# Patient Record
Sex: Male | Born: 1970 | Race: Black or African American | Hispanic: No | Marital: Married | Smoking: Current some day smoker
Health system: Southern US, Community
[De-identification: ages and names within clinical notes are randomized; demographics above are authoritative.]

## PROBLEM LIST (undated history)

## (undated) DIAGNOSIS — J189 Pneumonia, unspecified organism: Secondary | ICD-10-CM

## (undated) DIAGNOSIS — F32A Depression, unspecified: Secondary | ICD-10-CM

## (undated) DIAGNOSIS — F99 Mental disorder, not otherwise specified: Secondary | ICD-10-CM

## (undated) DIAGNOSIS — Z21 Asymptomatic human immunodeficiency virus [HIV] infection status: Secondary | ICD-10-CM

## (undated) DIAGNOSIS — A4902 Methicillin resistant Staphylococcus aureus infection, unspecified site: Secondary | ICD-10-CM

## (undated) DIAGNOSIS — F329 Major depressive disorder, single episode, unspecified: Secondary | ICD-10-CM

## (undated) HISTORY — PX: OTHER SURGICAL HISTORY: SHX169

---

## 2009-03-08 ENCOUNTER — Emergency Department (HOSPITAL_COMMUNITY): Admission: EM | Admit: 2009-03-08 | Discharge: 2009-03-08 | Payer: Self-pay | Admitting: Emergency Medicine

## 2009-03-09 ENCOUNTER — Emergency Department (HOSPITAL_COMMUNITY): Admission: EM | Admit: 2009-03-09 | Discharge: 2009-03-09 | Payer: Self-pay | Admitting: Emergency Medicine

## 2010-03-15 ENCOUNTER — Emergency Department (HOSPITAL_COMMUNITY): Admission: EM | Admit: 2010-03-15 | Discharge: 2010-03-15 | Payer: Self-pay | Admitting: Emergency Medicine

## 2010-03-18 ENCOUNTER — Telehealth: Payer: Self-pay | Admitting: Internal Medicine

## 2010-04-02 ENCOUNTER — Telehealth: Payer: Self-pay | Admitting: Internal Medicine

## 2010-04-14 ENCOUNTER — Encounter: Payer: Self-pay | Admitting: Internal Medicine

## 2010-12-07 NOTE — Letter (Signed)
Summary: Certified Letter Receipt  Certified Letter Receipt   Imported By: Sherian Rein 05/31/2010 14:37:42  _____________________________________________________________________  External Attachment:    Type:   Image     Comment:   External Document

## 2010-12-07 NOTE — Letter (Signed)
Summary: [facility]  [address]   [place], [place] [address]   Phone: [phone]  Fax: [phone]    04/14/2010  Kyle Young [address] [place], [place]  [address]  Dear Kyle Young,      We have been attempting to contact you in regards to a missed appointment that was scheduled for 04/01/2010 with Dr. Marchelle Gearing. We have been unable to reach you to reschedule this appointment. Please contact our office at your earliest convenience so we can assist you in this matter. Thank you.         Sincerely,   Nature conservation officer Pulmonary Division

## 2010-12-07 NOTE — Progress Notes (Signed)
Summary: nos appt  Phone Note Call from Patient   Caller: [email or web address]  Call For: Nelle Sayed Summary of Call: ATC pt to rsc nos from 5/26, phone does not accept incoming calls, no other contact info available. Initial call taken by: Darletta Moll,  Apr 02, 2010 9:10 AM     Appended Document: nos appt pls send certified letter  Appended Document: nos appt letter printed and given to Raliegh Scarlet to send certified.   Appended Document: nos appt received delivery notice for the certified letter sent to pt. It was delivered on 04-19-10 and was signed for by [employer].

## 2010-12-07 NOTE — Progress Notes (Signed)
Summary: fu needed  Phone Note Outgoing Call   Summary of Call: jen, he went to er on 5/9 with hemoptysis. pls give him an appt with me. For some reason the ER sent me a note that I am his doc.  Initial call taken by: Kalman Shan MD,  Mar 18, 2010 4:21 PM  Follow-up for Phone Call        pt has appt on May 26th with MR.,

## 2010-12-23 ENCOUNTER — Other Ambulatory Visit: Payer: Self-pay | Admitting: General Surgery

## 2010-12-23 ENCOUNTER — Encounter (HOSPITAL_BASED_OUTPATIENT_CLINIC_OR_DEPARTMENT_OTHER): Payer: Self-pay

## 2010-12-23 ENCOUNTER — Ambulatory Visit (HOSPITAL_BASED_OUTPATIENT_CLINIC_OR_DEPARTMENT_OTHER)
Admission: RE | Admit: 2010-12-23 | Discharge: 2010-12-23 | Disposition: A | Discharge status: Home / Self Care | Payer: Managed Care, Other (non HMO) | Source: Ambulatory Visit | Attending: General Surgery | Admitting: General Surgery

## 2010-12-23 ENCOUNTER — Ambulatory Visit (HOSPITAL_COMMUNITY): Payer: Managed Care, Other (non HMO) | Attending: General Surgery

## 2010-12-23 DIAGNOSIS — F17200 Nicotine dependence, unspecified, uncomplicated: Secondary | ICD-10-CM | POA: Insufficient documentation

## 2010-12-23 DIAGNOSIS — K644 Residual hemorrhoidal skin tags: Secondary | ICD-10-CM | POA: Insufficient documentation

## 2010-12-23 DIAGNOSIS — Z8614 Personal history of Methicillin resistant Staphylococcus aureus infection: Secondary | ICD-10-CM | POA: Insufficient documentation

## 2010-12-23 DIAGNOSIS — Z01812 Encounter for preprocedural laboratory examination: Secondary | ICD-10-CM | POA: Insufficient documentation

## 2010-12-23 DIAGNOSIS — Z21 Asymptomatic human immunodeficiency virus [HIV] infection status: Secondary | ICD-10-CM | POA: Insufficient documentation

## 2010-12-23 DIAGNOSIS — E119 Type 2 diabetes mellitus without complications: Secondary | ICD-10-CM | POA: Insufficient documentation

## 2010-12-23 DIAGNOSIS — A630 Anogenital (venereal) warts: Secondary | ICD-10-CM | POA: Insufficient documentation

## 2010-12-23 DIAGNOSIS — Z794 Long term (current) use of insulin: Secondary | ICD-10-CM | POA: Insufficient documentation

## 2010-12-23 DIAGNOSIS — I517 Cardiomegaly: Secondary | ICD-10-CM | POA: Insufficient documentation

## 2010-12-23 DIAGNOSIS — K648 Other hemorrhoids: Secondary | ICD-10-CM | POA: Insufficient documentation

## 2010-12-23 DIAGNOSIS — Z01818 Encounter for other preprocedural examination: Secondary | ICD-10-CM | POA: Insufficient documentation

## 2010-12-23 LAB — DIFFERENTIAL
Basophils Absolute: 0 10*3/uL (ref 0.0–0.1)
Eosinophils Absolute: 0.1 10*3/uL (ref 0.0–0.7)
Lymphocytes Relative: 51 % — ABNORMAL HIGH (ref 12–46)
Lymphs Abs: 2.7 10*3/uL (ref 0.7–4.0)
Neutro Abs: 2.2 10*3/uL (ref 1.7–7.7)
Neutrophils Relative %: 42 % — ABNORMAL LOW (ref 43–77)

## 2010-12-23 LAB — BASIC METABOLIC PANEL
BUN: 15 mg/dL (ref 6–23)
Calcium: 9.5 mg/dL (ref 8.4–10.5)
Chloride: 107 mEq/L (ref 96–112)
GFR calc non Af Amer: >60 mL/min (ref >60)
Glucose, Bld: 239 mg/dL — ABNORMAL HIGH (ref 70–99)
Potassium: 3.9 mEq/L (ref 3.5–5.1)

## 2010-12-23 LAB — GLUCOSE, CAPILLARY: Glucose-Capillary: 231 mg/dL — ABNORMAL HIGH (ref 70–99)

## 2010-12-23 LAB — CBC
HCT: 41.2 % (ref 39.0–52.0)
WBC: 5.3 10*3/uL (ref 4.0–10.5)

## 2010-12-30 NOTE — Op Note (Signed)
[name], Young NO.:  [id number]  MEDICAL RECORD NO.:  [id number]          PATIENT TYPE:  LOCATION:                                 FACILITY:  PHYSICIAN:  Anselm Pancoast. Zachery Dakins, M.D.  DATE OF BIRTH:  DATE OF PROCEDURE:  12/23/2010 DATE OF DISCHARGE:                              OPERATIVE REPORT   REFERRING PHYSICIAN:  Caryn Bee L. Little, MD  PREOPERATIVE DIAGNOSIS:  Perianal and inner anal condyloma acuminata.  POSTOPERATIVE DIAGNOSIS:  Perianal and inner anal condyloma acuminata.  OPERATION:  Cauterization and excision of perianal condyloma and condyloma within the anal canal under general anesthesia in lithotomy position.  HISTORY:  Kyle Young is a 40 year old black male referred to me by Dr. Catha Gosselin for a perianal condyloma.  He is on his usual medications.  He is a diabetic and Dr. Clarene Duke is changing his insulin NovoLog to Lantus and his sugar this morning was 253, which the patient states is better than usual.  The Anesthesiology, Dr. Gilford Rile, says that we are to proceed on with the planned procedure.  We will check his glucose postoperatively.  He had his insulin last night and a light meal.  DESCRIPTION OF PROCEDURE:  He was given a gram of Ancef and the patient taken to the operative suite.  Induction of general anesthesia and LMA tube and then placed up in the [place] stirrups.  We brought his legs up as much as possible.  He is large, kind of chunky guy and this exposes the perianal areas nicely and I also see the condyloma within the anal canal.  I prepped him with Hibiclens and then draped him in a sterile manner with the sheet under the buttocks and towels and then the leggings.  The areas first on the larger lesions on the outside, I actually just trimmed them off and then cauterized the base.  Numerous samples were sent for pathology exam and then after the areas could be completely circumferentially treated and there was  probably 20-25 areas. I then used the anoscopes and he had got at least many areas up in the anal canal.  It was not really above the dentate line area, but with kind of mild internal and external hemorrhoids.  I cauterized these, excised them and then recauterized and then there were 3 little areas done with figure-of-8 sutures of 3-0 chromic for hemostasis since it was working right on the hemorrhoids.  After the areas had all been treated, reinspected to make sure I had not missed any areas.  I then used 5% lidocaine ointment within an open gauze and placed part of it within the anal canal for hemostasis and also pain medication.  The patient tolerated procedure nicely, and he will be released after a short stay in the recovery room.  He is to continue on all of his medications today especially his insulin and he sees Dr. Clarene Duke next week.  The patient will be seen in my office in approximately 2 weeks.  I will let him start soaking in the tub this evening, applying little Betadine solution topically to the perianal areas, trying  to keep the areas dry and using lidocaine for pain medicine as sparingly as needed.  I will see him in 2 weeks.     Anselm Pancoast. Zachery Dakins, M.D.     WJW/MEDQ  D:  12/23/2010  T:  12/24/2010  Job:  [id number]  cc:   Caryn Bee L. Little, M.D. Fax: [phone]  Electronically Signed by Consuello Bossier M.D. on 12/30/2010 09:44:09 AM

## 2011-01-21 ENCOUNTER — Emergency Department (INDEPENDENT_AMBULATORY_CARE_PROVIDER_SITE_OTHER): Payer: Managed Care, Other (non HMO)

## 2011-01-21 ENCOUNTER — Emergency Department (HOSPITAL_BASED_OUTPATIENT_CLINIC_OR_DEPARTMENT_OTHER)
Admission: EM | Admit: 2011-01-21 | Discharge: 2011-01-21 | Disposition: A | Discharge status: Home / Self Care | Payer: Managed Care, Other (non HMO) | Attending: Emergency Medicine | Admitting: Emergency Medicine

## 2011-01-21 DIAGNOSIS — Z21 Asymptomatic human immunodeficiency virus [HIV] infection status: Secondary | ICD-10-CM | POA: Insufficient documentation

## 2011-01-21 DIAGNOSIS — E119 Type 2 diabetes mellitus without complications: Secondary | ICD-10-CM | POA: Insufficient documentation

## 2011-01-21 DIAGNOSIS — J189 Pneumonia, unspecified organism: Secondary | ICD-10-CM

## 2011-01-21 DIAGNOSIS — R05 Cough: Secondary | ICD-10-CM | POA: Insufficient documentation

## 2011-01-21 DIAGNOSIS — R059 Cough, unspecified: Secondary | ICD-10-CM | POA: Insufficient documentation

## 2011-01-21 LAB — BASIC METABOLIC PANEL
BUN: 9 mg/dL (ref 6–23)
Calcium: 9 mg/dL (ref 8.4–10.5)
Chloride: 103 mEq/L (ref 96–112)
GFR calc Af Amer: >60 mL/min (ref >60)
Glucose, Bld: 277 mg/dL — ABNORMAL HIGH (ref 70–99)

## 2011-01-21 LAB — DIFFERENTIAL
Eosinophils Relative: 3 % (ref 0–5)
Lymphocytes Relative: 28 % (ref 12–46)
Monocytes Absolute: 0.4 10*3/uL (ref 0.1–1.0)
Monocytes Relative: 8 % (ref 3–12)
Neutro Abs: 3.2 10*3/uL (ref 1.7–7.7)
Neutrophils Relative %: 61 % (ref 43–77)

## 2011-01-21 LAB — CBC
HCT: 39.3 % (ref 39.0–52.0)
MCH: 31.7 pg (ref 26.0–34.0)
MCHC: 35.4 g/dL (ref 30.0–36.0)
Platelets: 158 10*3/uL (ref 150–400)
RDW: 11.8 % (ref 11.5–15.5)
WBC: 5.3 10*3/uL (ref 4.0–10.5)

## 2011-01-22 ENCOUNTER — Inpatient Hospital Stay (HOSPITAL_COMMUNITY): Payer: Managed Care, Other (non HMO)

## 2011-01-22 ENCOUNTER — Inpatient Hospital Stay (HOSPITAL_COMMUNITY)
Admission: AD | Admit: 2011-01-22 | Discharge: 2011-01-28 | DRG: 976 | Disposition: A | Discharge status: Home Health | Payer: Managed Care, Other (non HMO) | Source: Other Acute Inpatient Hospital | Attending: Internal Medicine | Admitting: Internal Medicine

## 2011-01-22 ENCOUNTER — Emergency Department (INDEPENDENT_AMBULATORY_CARE_PROVIDER_SITE_OTHER): Payer: Managed Care, Other (non HMO)

## 2011-01-22 ENCOUNTER — Emergency Department (HOSPITAL_BASED_OUTPATIENT_CLINIC_OR_DEPARTMENT_OTHER)
Admission: EM | Admit: 2011-01-22 | Discharge: 2011-01-22 | Disposition: A | Discharge status: Other Acute Inpatient Hospital | Payer: Managed Care, Other (non HMO) | Source: Home / Self Care | Attending: Emergency Medicine | Admitting: Emergency Medicine

## 2011-01-22 DIAGNOSIS — Z21 Asymptomatic human immunodeficiency virus [HIV] infection status: Secondary | ICD-10-CM | POA: Insufficient documentation

## 2011-01-22 DIAGNOSIS — E109 Type 1 diabetes mellitus without complications: Secondary | ICD-10-CM | POA: Diagnosis present

## 2011-01-22 DIAGNOSIS — B20 Human immunodeficiency virus [HIV] disease: Secondary | ICD-10-CM

## 2011-01-22 DIAGNOSIS — Z8614 Personal history of Methicillin resistant Staphylococcus aureus infection: Secondary | ICD-10-CM

## 2011-01-22 DIAGNOSIS — E119 Type 2 diabetes mellitus without complications: Secondary | ICD-10-CM | POA: Insufficient documentation

## 2011-01-22 DIAGNOSIS — R509 Fever, unspecified: Secondary | ICD-10-CM

## 2011-01-22 DIAGNOSIS — Z794 Long term (current) use of insulin: Secondary | ICD-10-CM

## 2011-01-22 DIAGNOSIS — F1411 Cocaine abuse, in remission: Secondary | ICD-10-CM | POA: Diagnosis present

## 2011-01-22 DIAGNOSIS — Z79899 Other long term (current) drug therapy: Secondary | ICD-10-CM | POA: Insufficient documentation

## 2011-01-22 DIAGNOSIS — F17200 Nicotine dependence, unspecified, uncomplicated: Secondary | ICD-10-CM | POA: Diagnosis present

## 2011-01-22 DIAGNOSIS — J15212 Pneumonia due to Methicillin resistant Staphylococcus aureus: Secondary | ICD-10-CM | POA: Diagnosis present

## 2011-01-22 DIAGNOSIS — R05 Cough: Secondary | ICD-10-CM

## 2011-01-22 DIAGNOSIS — J189 Pneumonia, unspecified organism: Secondary | ICD-10-CM | POA: Insufficient documentation

## 2011-01-22 LAB — COMPREHENSIVE METABOLIC PANEL
AST: 30 U/L (ref 0–37)
Alkaline Phosphatase: 82 U/L (ref 39–117)
BUN: 11 mg/dL (ref 6–23)
CO2: 26 mEq/L (ref 19–32)
Calcium: 9.5 mg/dL (ref 8.4–10.5)
Chloride: 101 mEq/L (ref 96–112)
Creatinine, Ser: 0.7 mg/dL (ref 0.4–1.5)
Glucose, Bld: 218 mg/dL — ABNORMAL HIGH (ref 70–99)
Potassium: 4.6 mEq/L (ref 3.5–5.1)
Total Bilirubin: 3 mg/dL — ABNORMAL HIGH (ref 0.3–1.2)

## 2011-01-22 LAB — CBC
MCHC: 35.1 g/dL (ref 30.0–36.0)
Platelets: 181 10*3/uL (ref 150–400)
RBC: 4.53 MIL/uL (ref 4.22–5.81)
RDW: 11.9 % (ref 11.5–15.5)

## 2011-01-22 LAB — DIFFERENTIAL
Basophils Relative: 0 % (ref 0–1)
Eosinophils Absolute: 0.2 10*3/uL (ref 0.0–0.7)
Lymphs Abs: 1 10*3/uL (ref 0.7–4.0)
Monocytes Absolute: 0.5 10*3/uL (ref 0.1–1.0)
Neutrophils Relative %: 73 % (ref 43–77)

## 2011-01-22 MED ORDER — IOHEXOL 300 MG/ML  SOLN: 80.0000 mL | Freq: Once | INTRAMUSCULAR | Status: AC | PRN

## 2011-01-22 NOTE — H&P (Signed)
NAMEPAULINO, CORK NO.:  [id number]  MEDICAL RECORD NO.:  [id number]           PATIENT TYPE:  I  LOCATION:  1510                         FACILITY:  [facility]  PHYSICIAN:  Kyle Blower, MD       DATE OF BIRTH:  02/25/1971  DATE OF ADMISSION:  01/22/2011 DATE OF DISCHARGE:                             HISTORY & PHYSICAL   PRIMARY CARE PHYSICIAN:  Caryn Bee L. Little, MD  CHIEF COMPLAINT:  Shortness of breath.  HISTORY OF PRESENT ILLNESS:  Kyle Young is a 40 year old African American male with history of HIV, on chronic antiviral therapy; diabetes; cocaine abuse; tobacco use; who presents with the above complaints.  The patient reports that he has been at [facility] for rehab for cocaine use and was recently discharged 3 to 4 days ago and subsequently, after discharge he has noted that he has been having dry cough and shortness of breath.  Yesterday, he reports that he had a persistent fever of102 as a result he presents to the ER, for which he was given moxifloxacin for community-acquired pneumonia.  The patient continued to have fever and was having increasing shortness of breath, as a result he presented to the ER for further evaluation. He reported this morning with the persistent cough, he had a small episode where he coughed up some blood and subsequently after that had a nosebleed.  Other than fever, denies any nausea and vomiting and does complain about chest pain with a persistent cough that he has had.  Does complain about shortness of breath.  Denies any abdominal pain, diarrhea, headaches, or vision changes.  REVIEW OF SYSTEMS:  All systems were reviewed with the patient and was positive as per HPI, otherwise all other systems are negative.  PAST MEDICAL HISTORY: 1. History of pneumonia. 2. History of HIV, is on antiviral therapy, reports that he gets his     care at [place], [place] reports that about 2 months ago, his     viral load was  undetectable. 3. History of type 1 diabetes, is on Lantus. 4. History of cocaine use, has recently completed a rehabilitation at     [facility]. 5. Smoking and tobacco use.  Smokes 1 pack per day. 6. History of peribronchovascular nodular density measuring 11-mm in     May 2010, thought to be from an infectious etiology.  SOCIAL HISTORY:  The patient smokes 1 pack per day.  Denies any alcohol use.  Reports that he has not used any cocaine in the last at least in the last month since he has attended rehabilitation at [facility].  FAMILY HISTORY:  Significant for mother and father having diabetes.  PHYSICAL EXAM: VITALS: 100.8, 108, 20, 135/71, 95% on 2L by [place] [facility]: Awake, in some distress from cough. HEENT: EOMI, PER, moist mucous membrane. CV: Regular, S1 and S2. Lungs: Clear good air movement. Abdomen: soft, nt, nd, +bs. Ext: Good pulses with trace edema. Neuro: CN II-XII grossly intact.  RADIOLOGY/IMAGING:  The patient had chest x-ray 2-view, which showed hazy infiltrates/pneumonia in the right lower lobe.  On January 21, 2011, the patient had  another chest x-ray today, which showed mild worsening of asymmetric pulmonary airspace disease involving right greater than left.  LABORATORY DATA:  CBC shows a white count of 6.0, hemoglobin 14.3, hematocrit 40.7, platelet count 181.  Electrolytes normal with a creatinine of 0.7.  Liver function tests normal except total bilirubin is 3.0.  ASSESSMENT/PLAN: 1. Shortness of breath/pneumonia.  Differential is broad at this time.     May be due to community-acquired pneumonia versus possible PCP     pneumonia, though less likely given that he reports that about 2     months ago, his viral load was detectable.  Infectious Disease has     been consulted and will make appropriate antibiotic     recommendations.  Dr. Orvan Falconer will see the patient, appriciate     ID input. 2. HIV on antiviral therapy.  We will resume his home  antiviral     therapy.  Further management as per Dr. Orvan Falconer with Infectious     Diseases. 3. Diabetes, stable.  Continue Lantus and sliding scale insulin at     this time. 4. Tobacco abuse.  The patient was counseled on smoking cessation.     Nicotine patch provided. 5. Cocaine use.  Encouraged the patient on cessation of cocaine use,     has recently completed rehabilitation at [facility]. 6. History of peribronchovascular nodular density measuring 11 mm in     May 2012.  We will get a CT of the chest with contrast for better     evaluation.  At that time, it was thought that the nodular     densities were likely secondary to an infectious etiology. 7. Questionable hemoptysis.  May be from nosebleed; however, currently     the patient does not have any further hemoptysis at this time.  We     will continue to monitor for now. 8. Chest pain, likely secondary to cough.  EKG shows a normal sinus     rhythm.  We will give him cough suppressive medications. 9. Prophylaxis.  SCDs for DVT prophylaxis. 10.Code status.  The patient is full code.   Kyle Blower, MD   SR/MEDQ  D:  01/22/2011  T:  01/22/2011  Job:  [id number]  Electronically Signed by Wardell Heath Chanie Soucek  on 01/22/2011 11:15:25 PM

## 2011-01-23 LAB — GLUCOSE, CAPILLARY
Glucose-Capillary: 221 mg/dL — ABNORMAL HIGH (ref 70–99)
Glucose-Capillary: 219 mg/dL — ABNORMAL HIGH (ref 70–99)

## 2011-01-23 LAB — LEGIONELLA ANTIGEN, URINE

## 2011-01-23 LAB — DIFFERENTIAL
Basophils Absolute: 0 10*3/uL (ref 0.0–0.1)
Basophils Relative: 0 % (ref 0–1)
Eosinophils Absolute: 0.1 10*3/uL (ref 0.0–0.7)
Monocytes Relative: 6 % (ref 3–12)
Neutro Abs: 5.4 10*3/uL (ref 1.7–7.7)
Neutrophils Relative %: 73 % (ref 43–77)

## 2011-01-23 LAB — BASIC METABOLIC PANEL
Creatinine, Ser: 0.7 mg/dL (ref 0.4–1.5)
GFR calc non Af Amer: >60 mL/min (ref >60)
Glucose, Bld: 217 mg/dL — ABNORMAL HIGH (ref 70–99)
Potassium: 4 mEq/L (ref 3.5–5.1)

## 2011-01-23 LAB — CBC
HCT: 38.9 % — ABNORMAL LOW (ref 39.0–52.0)
MCHC: 33.9 g/dL (ref 30.0–36.0)
Platelets: 158 10*3/uL (ref 150–400)
WBC: 7.4 10*3/uL (ref 4.0–10.5)

## 2011-01-23 LAB — RAPID URINE DRUG SCREEN, HOSP PERFORMED
Barbiturates: NOT DETECTED
Benzodiazepines: NOT DETECTED
Cocaine: NOT DETECTED

## 2011-01-23 MED ORDER — IOHEXOL 300 MG/ML  SOLN
80.0000 mL | Freq: Once | INTRAMUSCULAR | Status: AC | PRN
  Administered 2011-01-23: 80 mL via INTRAVENOUS

## 2011-01-24 LAB — BLOOD GAS, ARTERIAL
Drawn by: 232811
O2 Content: 4 L/min
Patient temperature: 98.6
TCO2: 24.8 mmol/L (ref 0–100)
pH, Arterial: 7.421 (ref 7.350–7.450)

## 2011-01-24 LAB — BASIC METABOLIC PANEL
BUN: 8 mg/dL (ref 6–23)
Chloride: 97 mEq/L (ref 96–112)
Potassium: 3.8 mEq/L (ref 3.5–5.1)

## 2011-01-24 LAB — CBC
MCV: 92.4 fL (ref 78.0–100.0)
Platelets: 184 10*3/uL (ref 150–400)
RBC: 4.6 MIL/uL (ref 4.22–5.81)
WBC: 6.1 10*3/uL (ref 4.0–10.5)

## 2011-01-24 LAB — T-HELPER CELLS (CD4) COUNT (NOT AT ARMC): CD4 % Helper T Cell: 20 % — ABNORMAL LOW (ref 33–55)

## 2011-01-24 LAB — GLUCOSE, CAPILLARY
Glucose-Capillary: 169 mg/dL — ABNORMAL HIGH (ref 70–99)
Glucose-Capillary: 120 mg/dL — ABNORMAL HIGH (ref 70–99)
Glucose-Capillary: 224 mg/dL — ABNORMAL HIGH (ref 70–99)
Glucose-Capillary: 162 mg/dL — ABNORMAL HIGH (ref 70–99)

## 2011-01-24 LAB — EXPECTORATED SPUTUM ASSESSMENT W GRAM STAIN, RFLX TO RESP C

## 2011-01-24 LAB — OSMOLALITY, URINE: Osmolality, Ur: 651 mOsm/kg (ref 390–1090)

## 2011-01-25 DIAGNOSIS — J189 Pneumonia, unspecified organism: Secondary | ICD-10-CM

## 2011-01-25 DIAGNOSIS — B20 Human immunodeficiency virus [HIV] disease: Secondary | ICD-10-CM

## 2011-01-25 LAB — CBC
HCT: 39.3 % (ref 39.0–52.0)
Hemoglobin: 14.4 g/dL (ref 13.0–17.0)
MCHC: 33.6 g/dL (ref 30.0–36.0)
MCHC: 35.1 g/dL (ref 30.0–36.0)
MCV: 92 fL (ref 78.0–100.0)
MCV: 95.2 fL (ref 78.0–100.0)
Platelets: 200 10*3/uL (ref 150–400)
RBC: 4.3 MIL/uL (ref 4.22–5.81)
RDW: 12.2 % (ref 11.5–15.5)

## 2011-01-25 LAB — POCT I-STAT, CHEM 8
Calcium, Ion: 1.2 mmol/L (ref 1.12–1.32)
Chloride: 100 mEq/L (ref 96–112)
Glucose, Bld: 373 mg/dL — ABNORMAL HIGH (ref 70–99)
HCT: 45 % (ref 39.0–52.0)
Hemoglobin: 15.3 g/dL (ref 13.0–17.0)
TCO2: 27 mmol/L (ref 0–100)

## 2011-01-25 LAB — BLOOD GAS, ARTERIAL
Acid-Base Excess: 3 mmol/L — ABNORMAL HIGH (ref 0.0–2.0)
Bicarbonate: 27.3 mEq/L — ABNORMAL HIGH (ref 20.0–24.0)
O2 Saturation: 91 %
Patient temperature: 98.6
TCO2: 23.9 mmol/L (ref 0–100)
pH, Arterial: 7.424 (ref 7.350–7.450)

## 2011-01-25 LAB — GLUCOSE, CAPILLARY
Glucose-Capillary: 157 mg/dL — ABNORMAL HIGH (ref 70–99)
Glucose-Capillary: 196 mg/dL — ABNORMAL HIGH (ref 70–99)
Glucose-Capillary: 161 mg/dL — ABNORMAL HIGH (ref 70–99)
Glucose-Capillary: 160 mg/dL — ABNORMAL HIGH (ref 70–99)
Glucose-Capillary: 123 mg/dL — ABNORMAL HIGH (ref 70–99)

## 2011-01-25 LAB — BASIC METABOLIC PANEL
BUN: 11 mg/dL (ref 6–23)
Calcium: 8.7 mg/dL (ref 8.4–10.5)
GFR calc non Af Amer: >60 mL/min (ref >60)
Glucose, Bld: 115 mg/dL — ABNORMAL HIGH (ref 70–99)
Sodium: 134 mEq/L — ABNORMAL LOW (ref 135–145)

## 2011-01-25 LAB — DIFFERENTIAL
Basophils Relative: 0 % (ref 0–1)
Eosinophils Absolute: 0.1 10*3/uL (ref 0.0–0.7)
Eosinophils Relative: 2 % (ref 0–5)
Monocytes Absolute: 0.4 10*3/uL (ref 0.1–1.0)
Monocytes Relative: 5 % (ref 3–12)

## 2011-01-25 LAB — POCT CARDIAC MARKERS: Troponin i, poc: <0.05 ng/mL (ref 0.00–0.09)

## 2011-01-26 LAB — BASIC METABOLIC PANEL
BUN: 9 mg/dL (ref 6–23)
Calcium: 8.8 mg/dL (ref 8.4–10.5)
Creatinine, Ser: 0.52 mg/dL (ref 0.4–1.5)
GFR calc non Af Amer: >60 mL/min (ref >60)
Glucose, Bld: 96 mg/dL (ref 70–99)
Potassium: 3.7 mEq/L (ref 3.5–5.1)

## 2011-01-26 LAB — GLUCOSE, CAPILLARY
Glucose-Capillary: 140 mg/dL — ABNORMAL HIGH (ref 70–99)
Glucose-Capillary: 111 mg/dL — ABNORMAL HIGH (ref 70–99)

## 2011-01-26 LAB — CBC
HCT: 37.9 % — ABNORMAL LOW (ref 39.0–52.0)
MCHC: 33.2 g/dL (ref 30.0–36.0)
MCV: 92.2 fL (ref 78.0–100.0)
Platelets: 230 10*3/uL (ref 150–400)
RDW: 12.2 % (ref 11.5–15.5)
WBC: 5.4 10*3/uL (ref 4.0–10.5)

## 2011-01-26 LAB — VANCOMYCIN, TROUGH: Vancomycin Tr: 11.1 ug/mL (ref 10.0–20.0)

## 2011-01-26 LAB — HIV-1 RNA ULTRAQUANT REFLEX TO GENTYP+: HIV-1 RNA Quant, Log: 1.41 {Log} — ABNORMAL HIGH (ref <1.30)

## 2011-01-27 ENCOUNTER — Inpatient Hospital Stay (HOSPITAL_COMMUNITY): Payer: Managed Care, Other (non HMO)

## 2011-01-27 LAB — GLUCOSE, CAPILLARY
Glucose-Capillary: 207 mg/dL — ABNORMAL HIGH (ref 70–99)
Glucose-Capillary: 102 mg/dL — ABNORMAL HIGH (ref 70–99)
Glucose-Capillary: 200 mg/dL — ABNORMAL HIGH (ref 70–99)

## 2011-01-27 LAB — GLUCOSE 6 PHOSPHATE DEHYDROGENASE: G-6-PD, Quant: 10.9 U/GM HGB (ref 7.0–20.5)

## 2011-01-27 LAB — HIV-1 RNA QUANT-NO REFLEX-BLD: HIV-1 RNA Quant, Log: 1.59 {Log} — ABNORMAL HIGH (ref <1.30)

## 2011-01-28 LAB — GLUCOSE, CAPILLARY
Glucose-Capillary: 155 mg/dL — ABNORMAL HIGH (ref 70–99)
Glucose-Capillary: 98 mg/dL (ref 70–99)

## 2011-01-29 LAB — CULTURE, BLOOD (ROUTINE X 2): Culture: NO GROWTH

## 2011-02-03 NOTE — Discharge Summary (Signed)
Kyle Young, Kyle Young               ACCOUNT NO.:  [id number]  MEDICAL RECORD NO.:  [id number]           PATIENT TYPE:  I  LOCATION:  1510                         FACILITY:  [facility]  PHYSICIAN:  Kela Millin, M.D.DATE OF BIRTH:  06-28-1971  DATE OF ADMISSION:  01/22/2011 DATE OF DISCHARGE:  01/28/2011                        DISCHARGE SUMMARY - REFERRING   DISCHARGE DIAGNOSES: 1. Pneumonia, multifocal - Probable methicillin-resistant     Staphylococcus aureus per infectious disease. 2. Diabetes mellitus. 3. Human immunodeficiency virus - Diagnosed in 2007. 4. History of cocaine abuse - Recent completion of a 34-day inpatient     rehab. 5. History of perianal condyloma - Status post cauterization in     February 2012. 6. 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 7. Tobacco abuse. 8. History of pneumonia. 9. History of peribronchovascular nodular density measuring 11 mm in     May of 2010 thought to be from an infectious etiology.  PROCEDURES AND STUDIES: 1. Chest x-ray on January 22, 2011 - Mild worsening of asymmetric     pulmonary airspace disease involving right lung greater than left. 2. CT scan of chest with contrast on January 23, 2011 - Parenchymal     airspace disease primarily involving the right lobe, but also in a     patchy distribution throughout the remainder of the lungs.  Most     consistent with multifocal pneumonia.  Small pleural effusions. 3. Chest x-ray on January 27, 2011 - PICC line in good position,     improving bilateral pulmonary infiltrates.  CONSULTATIONS:  Infectious disease - Cliffton Asters, M.D. and Dr. Acey Lav, M.D.  BRIEF HISTORY:  The patient is a 40 year old black male with the above- listed medical problems, on chronic antiretrovirals for his HIV, who reported that he had been at the [facility] for rehab for cocaine abuse and was just discharged 3-4  days prior to this admission and following his discharge, he noted he was having a dry cough and shortness of breath.  On the day prior to admission, he had a persistent fever of 102 and so he came to the ED and was started on Avelox for a community-acquired pneumonia.  He continued to have fevers with increasing shortness of breath and as a result came back to the ED.  He reported on the morning of admission that the cough was persistent and he had an episode of hemoptysis and following that he had a nosebleed. He admitted to chest pain associated with his cough.  He denied nausea or vomiting and also denied diarrhea, abdominal pain.  No headaches. Also, no visual changes.  He was seen in the ED.  The chest x-ray was done and the results are as stated above.  He was admitted for further evaluation and management.  HOSPITAL COURSE: 1. Bilateral/multifocal pneumonia, probable MRSA per infectious     disease - Upon admission, the patient was started on broad-spectrum     antibiotics with vancomycin and Zosyn and infectious disease was consulted and Dr. Cliffton Asters saw the patient initially and his  impression was that the patient had a community-acquired pneumonia     and so he discontinued the Zosyn and the patient was started     instead on Cipro along with Tamiflu and vancomycin was discontinued     as well.  The patient was remaining febrile with fevers up to 103     and was still very symptomatic with cough and shortness of breath.     Dr. Daiva Eves followed up for infectious disease and added     ceftazidime and at that point he also restarted the vancomycin.     Studies obtained by infectious disease included a Legionella urine     antigen, which came back negative.  MRSA PCR screening also came     back negative.  Sputum cultures only came back with normal     oropharyngeal flora.  Patient defervesced on the vancomycin and     ceftazidime and his symptoms also have significantly  improved.  Dr.     Daiva Eves followed up with patient and his other antibiotics were     discontinued and he has been maintained on the vancomycin and     ceftazidime, his impression was that the patient did not have PCP     pneumonia.  His oxygenation improved as well and his O2 sats on     room air have been within normal limits - 94%-97% even with     ambulation.  Dr. Daiva Eves followed up with the patient on January 26, 2011 and ordered PICC line placement and home IV antibiotics with     vancomycin and ceftazidime through February 07, 2011 and weekly CBCs,     BMETs, vancomycin levels to be faxed to Dr. Daiva Eves at [phone].     Case management also set up for home health to assist with his     outpatient antibiotics and today he has continued to do well and     will be discharged on the IV antibiotics as well as antitussive and     he is to follow up outpatient with his primary care physician as     well as Dr. Daiva Eves. 2. HIV - As above.  Infectious disease consulted and follow patient in     the hospital.  His quantitative HIV RNA was done and came back at     26 with the log of 1.41.  The patient was maintained on his     antiretrovirals during this hospital stay and is to follow up     outpatient with infectious disease. 3. Diabetes mellitus - He was maintained on Lantus during this     hospital stay.  His Accu-Cheks were monitored and he was also     covered with sliding scale insulin. 4. History of cocaine abuse - As noted above, patient had completed a     34-day inpatient rehab for cocaine 3-4 days prior to this     admission.  He was counseled to stay off cocaine during this     hospital stay. 5. Tobacco abuse - Patient also counseled to quit smoking.  DISCHARGE MEDICATIONS: 1. Ceftazidime 2 grams IV q.8h. through February 07, 2011. 2. Tussionex 5 mL q.12h. p.r.n. 3. MiraLax 17 grams daily p.r.n. 4. Vancomycin 1500 mg IV q.8h. 5. Motrin 200 mg 2 tablets q.6h. p.r.n. 6. Lantus  30 units subcutaneously b.i.d. 7. Norvir 100 mg p.o. daily. 8. Reyataz 300 mg one p.o. daily. 9. Tessalon  Perles 100 mg one p.o. b.i.d. p.r.n. 10.Truvada 200/300 mg one p.o. daily.  DISCONTINUED MEDICATIONS:  Avelox.  FOLLOWUP CARE: 1. Dr. Catha Gosselin in 1-2 weeks. 2. Dr. Daiva Eves in 2 weeks, call for appointment at [phone].  DISCHARGE CONDITION:  Improved/stable.     Kela Millin, M.D.     ACV/MEDQ  D:  01/28/2011  T:  01/28/2011  Job:  [id number]  cc:   Caryn Bee L. Little, M.D. Fax: [phone]  Acey Lav, MD Fax: [phone]  Electronically Signed by Donnalee Curry M.D. on 02/03/2011 10:40:27 AM

## 2011-03-14 ENCOUNTER — Ambulatory Visit: Payer: Managed Care, Other (non HMO) | Admitting: Infectious Disease

## 2011-03-16 ENCOUNTER — Ambulatory Visit: Payer: Managed Care, Other (non HMO) | Admitting: Infectious Disease

## 2011-03-16 NOTE — Progress Notes (Unsigned)
  Subjective:    Patient ID: Kyle Young, male    DOB: 19-Oct-1971, 40 y.o.   MRN: [id number]  HPI   1. History of pneumonia.   2. History of HIV, is on antiviral therapy, reports that he gets his       care at [place], [place] reports that about 2 months ago, his       viral load was undetectable.   3. History of type 1 diabetes, is on Lantus.   4. History of cocaine use, has recently completed a rehabilitation at       [facility].   5. Smoking and tobacco use.  Smokes 1 pack per day.   6. History of peribronchovascular nodular density measuring 11-mm in       May 2010, thought to be from an infectious etiology.      SOCIAL HISTORY:  The patient smokes 1 pack per day.  Denies any alcohol   use.  Reports that he has not used any cocaine in the last at least in   the last month since he has attended rehabilitation at [facility].      FAMILY HISTORY:  Significant for mother and father having diabete  Review of Systems     Objective:   Physical Exam        Assessment & Plan:

## 2011-10-23 ENCOUNTER — Emergency Department (INDEPENDENT_AMBULATORY_CARE_PROVIDER_SITE_OTHER): Payer: Managed Care, Other (non HMO)

## 2011-10-23 ENCOUNTER — Encounter (HOSPITAL_BASED_OUTPATIENT_CLINIC_OR_DEPARTMENT_OTHER): Payer: Self-pay | Admitting: *Deleted

## 2011-10-23 ENCOUNTER — Emergency Department (HOSPITAL_BASED_OUTPATIENT_CLINIC_OR_DEPARTMENT_OTHER)
Admission: EM | Admit: 2011-10-23 | Discharge: 2011-10-23 | Disposition: A | Discharge status: Home / Self Care | Payer: Managed Care, Other (non HMO) | Attending: Emergency Medicine | Admitting: Emergency Medicine

## 2011-10-23 DIAGNOSIS — R0989 Other specified symptoms and signs involving the circulatory and respiratory systems: Secondary | ICD-10-CM

## 2011-10-23 DIAGNOSIS — E119 Type 2 diabetes mellitus without complications: Secondary | ICD-10-CM | POA: Insufficient documentation

## 2011-10-23 DIAGNOSIS — J40 Bronchitis, not specified as acute or chronic: Secondary | ICD-10-CM

## 2011-10-23 DIAGNOSIS — R05 Cough: Secondary | ICD-10-CM

## 2011-10-23 DIAGNOSIS — Z79899 Other long term (current) drug therapy: Secondary | ICD-10-CM | POA: Insufficient documentation

## 2011-10-23 DIAGNOSIS — F17200 Nicotine dependence, unspecified, uncomplicated: Secondary | ICD-10-CM | POA: Insufficient documentation

## 2011-10-23 DIAGNOSIS — Z21 Asymptomatic human immunodeficiency virus [HIV] infection status: Secondary | ICD-10-CM | POA: Insufficient documentation

## 2011-10-23 HISTORY — DX: Asymptomatic human immunodeficiency virus (hiv) infection status: Z21

## 2011-10-23 HISTORY — DX: Pneumonia, unspecified organism: J18.9

## 2011-10-23 HISTORY — DX: Methicillin resistant Staphylococcus aureus infection, unspecified site: A49.02

## 2011-10-23 MED ORDER — MOXIFLOXACIN HCL 400 MG PO TABS: 400.0000 mg | ORAL_TABLET | Freq: Every day | ORAL | Status: AC

## 2011-10-23 MED ORDER — PREDNISONE 10 MG PO TABS: 20.0000 mg | ORAL_TABLET | Freq: Every day | ORAL | Status: DC

## 2011-10-23 NOTE — ED Provider Notes (Addendum)
History    This chart was scribed for Toy Baker, MD, MD by Smitty Pluck. The patient was seen in room MH07 and the patient's care was started at 7:20PM.   CSN: [id number] Arrival date & time: 10/23/2011  7:10 PM   First MD Initiated Contact with Patient 10/23/11 1912      Chief Complaint  Patient presents with  . Cough    (Consider location/radiation/quality/duration/timing/severity/associated sxs/prior treatment) The history is provided by the patient.   Kyle Young is a 40 y.o. male who presents to the Emergency Department complaining of persistent productive cough (white sputum) onset 3 weeks ago. Pt reports sx of  vomiting after coughing. He denies, fever, diarrhea and nausea. He also reports sore throat onset 2 days ago. Pt is HIV + and his last viral load was undetected. He has a history of MRSA over different areas of the body and has had pneumonia before and current sx are similar. Pt is a smoker of tobacco (everyday). No meds taken pta.   Past Medical History  Diagnosis Date  . Pneumonia   . Diabetes mellitus   . HIV positive   . MRSA (methicillin resistant Staphylococcus aureus)     Past Surgical History  Procedure Date  . Mrsa surgery     History reviewed. No pertinent family history.  History  Substance Use Topics  . Smoking status: Current Everyday Smoker  . Smokeless tobacco: Not on file  . Alcohol Use: Yes      Review of Systems  All other systems reviewed and are negative.   10 Systems reviewed and are negative for acute change except as noted in the HPI.  Allergies  Bactrim  Home Medications   Current Outpatient Rx  Name Route Sig Dispense Refill  . ACETAMINOPHEN 500 MG PO TABS Oral Take 1,000 mg by mouth every 6 (six) hours as needed. For fever     . REYATAZ PO Oral Take 1 tablet by mouth daily.      . INSULIN ASPART 100 UNIT/ML [place] SOLN Subcutaneous Inject 10-15 Units into the skin 3 (three) times daily before meals. According to  sliding scale     . INSULIN GLARGINE 100 UNIT/ML [place] SOLN Subcutaneous Inject 20-25 Units into the skin at bedtime.      . ADULT MULTIVITAMIN W/MINERALS CH Oral Take 1 tablet by mouth daily.      Marland Kitchen PRESCRIPTION MEDICATION Oral Take 1 tablet by mouth daily. Retrovirus HIV medication     . RITONAVIR 100 MG PO CAPS Oral Take 100 mg by mouth daily.        BP 101/81  Pulse 97  Temp(Src) 97.8 F (36.6 C) (Oral)  Resp 18  Ht 5\' 9"  (1.753 m)  Wt 210 lb (95.255 kg)  BMI 31.01 kg/m2  SpO2 100%  Physical Exam  Nursing note and vitals reviewed. Constitutional: He is oriented to person, place, and time. He appears well-developed and well-nourished. No distress.  HENT:  Head: Normocephalic and atraumatic.  Eyes: EOM are normal. Pupils are equal, round, and reactive to light.  Neck: Normal range of motion. Neck supple. No tracheal deviation present.       No lymphadenopathy    Cardiovascular: Normal rate and normal heart sounds.   Pulmonary/Chest: Effort normal and breath sounds normal. No respiratory distress.       Lungs clear bilaterally   Abdominal: Soft. He exhibits no distension. There is no tenderness.  Musculoskeletal: Normal range of motion.  Neurological: He is  alert and oriented to person, place, and time.  Skin: Skin is warm and dry.  Psychiatric: He has a normal mood and affect. His behavior is normal.    ED Course  Procedures (including critical care time)  DIAGNOSTIC STUDIES: Oxygen Saturation is 100% on room air, normal by my interpretation.    COORDINATION OF CARE:    Labs Reviewed - No data to display Dg Chest 2 View  10/23/2011  *RADIOLOGY REPORT*  Clinical Data: Cough and congestion.  CHEST - 2 VIEW  Comparison: Chest x-ray 02/09/2011.  Findings: The cardiac silhouette, mediastinal and hilar contours are within normal limits and stable.  The lungs demonstrate bronchitic changes with peribronchial thickening and slight increased interstitial markings suggesting  bronchitis.  No focal infiltrates or effusions.  The bony thorax is intact.  IMPRESSION: Findings suggest bronchitis.  No focal infiltrates.  Original Report Authenticated By: P. Loralie Champagne, M.D.     No diagnosis found.    MDM  Pt to be treated with avelox, and prednisone for his bronchitis. Will f/u his pcp as needed      I personally performed the services described in this documentation, which was scribed in my presence. The recorded information has been reviewed and considered.     Toy Baker, MD 10/23/11 2011  Toy Baker, MD 10/23/11 2013

## 2011-10-23 NOTE — ED Notes (Signed)
Pt states he has had cough, SHOB x 3 weeks. HIV+. Productive cough with white sputum. MRSA surgeries x 12 "on different areas of my body"

## 2012-02-04 ENCOUNTER — Encounter (HOSPITAL_COMMUNITY): Payer: Self-pay | Admitting: Emergency Medicine

## 2012-02-04 ENCOUNTER — Other Ambulatory Visit: Payer: Self-pay

## 2012-02-04 ENCOUNTER — Emergency Department (HOSPITAL_COMMUNITY): Payer: Managed Care, Other (non HMO)

## 2012-02-04 ENCOUNTER — Emergency Department (HOSPITAL_COMMUNITY)
Admission: EM | Admit: 2012-02-04 | Discharge: 2012-02-05 | Disposition: A | Discharge status: Other Acute Inpatient Hospital | Payer: Managed Care, Other (non HMO) | Attending: Emergency Medicine | Admitting: Emergency Medicine

## 2012-02-04 DIAGNOSIS — F1010 Alcohol abuse, uncomplicated: Secondary | ICD-10-CM | POA: Insufficient documentation

## 2012-02-04 DIAGNOSIS — F3289 Other specified depressive episodes: Secondary | ICD-10-CM | POA: Insufficient documentation

## 2012-02-04 DIAGNOSIS — Z21 Asymptomatic human immunodeficiency virus [HIV] infection status: Secondary | ICD-10-CM | POA: Insufficient documentation

## 2012-02-04 DIAGNOSIS — Z794 Long term (current) use of insulin: Secondary | ICD-10-CM | POA: Insufficient documentation

## 2012-02-04 DIAGNOSIS — E119 Type 2 diabetes mellitus without complications: Secondary | ICD-10-CM | POA: Insufficient documentation

## 2012-02-04 DIAGNOSIS — R002 Palpitations: Secondary | ICD-10-CM | POA: Insufficient documentation

## 2012-02-04 DIAGNOSIS — F1410 Cocaine abuse, uncomplicated: Secondary | ICD-10-CM | POA: Insufficient documentation

## 2012-02-04 DIAGNOSIS — F329 Major depressive disorder, single episode, unspecified: Secondary | ICD-10-CM | POA: Insufficient documentation

## 2012-02-04 DIAGNOSIS — R45851 Suicidal ideations: Secondary | ICD-10-CM | POA: Insufficient documentation

## 2012-02-04 LAB — BASIC METABOLIC PANEL
BUN: 10 mg/dL (ref 6–23)
CO2: 22 mEq/L (ref 19–32)
Chloride: 98 mEq/L (ref 96–112)
Creatinine, Ser: 0.69 mg/dL (ref 0.50–1.35)
Glucose, Bld: 233 mg/dL — ABNORMAL HIGH (ref 70–99)
Potassium: 3.5 mEq/L (ref 3.5–5.1)

## 2012-02-04 LAB — GLUCOSE, CAPILLARY: Glucose-Capillary: 276 mg/dL — ABNORMAL HIGH (ref 70–99)

## 2012-02-04 LAB — CBC
HCT: 42.2 % (ref 39.0–52.0)
Hemoglobin: 15 g/dL (ref 13.0–17.0)
MCH: 32.3 pg (ref 26.0–34.0)
MCHC: 35.5 g/dL (ref 30.0–36.0)
MCV: 90.9 fL (ref 78.0–100.0)
RDW: 13.3 % (ref 11.5–15.5)

## 2012-02-04 LAB — ETHANOL: Alcohol, Ethyl (B): 48 mg/dL — ABNORMAL HIGH (ref 0–11)

## 2012-02-04 LAB — RAPID URINE DRUG SCREEN, HOSP PERFORMED
Barbiturates: NOT DETECTED
Cocaine: POSITIVE — AB
Tetrahydrocannabinol: NOT DETECTED

## 2012-02-04 LAB — DIFFERENTIAL
Basophils Absolute: 0.1 10*3/uL (ref 0.0–0.1)
Basophils Relative: 1 % (ref 0–1)
Eosinophils Absolute: 0.1 10*3/uL (ref 0.0–0.7)
Lymphs Abs: 3.5 10*3/uL (ref 0.7–4.0)
Neutro Abs: 2.6 10*3/uL (ref 1.7–7.7)

## 2012-02-04 MED ORDER — INSULIN ASPART 100 UNIT/ML ~~LOC~~ SOLN: 0.0000 [IU] | Freq: Three times a day (TID) | SUBCUTANEOUS | Status: DC

## 2012-02-04 MED ORDER — NICOTINE 21 MG/24HR TD PT24
21.0000 mg | MEDICATED_PATCH | Freq: Every day | TRANSDERMAL | Status: DC
  Administered 2012-02-04 – 2012-02-05 (×2): 21 mg via TRANSDERMAL
  Filled 2012-02-04 (×2): qty 1

## 2012-02-04 MED ORDER — SODIUM CHLORIDE 0.9 % IV SOLN
INTRAVENOUS | Status: DC
  Administered 2012-02-04: 02:00:00 via INTRAVENOUS

## 2012-02-04 MED ORDER — ATAZANAVIR SULFATE 150 MG PO CAPS
300.0000 mg | ORAL_CAPSULE | Freq: Every day | ORAL | Status: DC
  Administered 2012-02-04: 300 mg via ORAL
  Filled 2012-02-04 (×3): qty 2

## 2012-02-04 MED ORDER — RITONAVIR 100 MG PO TABS
100.0000 mg | ORAL_TABLET | Freq: Every day | ORAL | Status: DC
  Administered 2012-02-04: 100 mg via ORAL
  Filled 2012-02-04 (×3): qty 1

## 2012-02-04 MED ORDER — SODIUM CHLORIDE 0.9 % IV BOLUS (SEPSIS)
500.0000 mL | Freq: Once | INTRAVENOUS | Status: AC
  Administered 2012-02-04: 1000 mL via INTRAVENOUS

## 2012-02-04 MED ORDER — INSULIN ASPART 100 UNIT/ML ~~LOC~~ SOLN: 0.0000 [IU] | Freq: Every day | SUBCUTANEOUS | Status: DC

## 2012-02-04 MED ORDER — LORAZEPAM 1 MG PO TABS
1.0000 mg | ORAL_TABLET | Freq: Once | ORAL | Status: AC
  Administered 2012-02-04: 1 mg via ORAL
  Filled 2012-02-04 (×2): qty 1

## 2012-02-04 MED ORDER — LORAZEPAM 1 MG PO TABS: 1.0000 mg | ORAL_TABLET | Freq: Three times a day (TID) | ORAL | Status: DC | PRN

## 2012-02-04 MED ORDER — ONDANSETRON HCL 8 MG PO TABS: 4.0000 mg | ORAL_TABLET | Freq: Three times a day (TID) | ORAL | Status: DC | PRN

## 2012-02-04 MED ORDER — EMTRICITABINE-TENOFOVIR DF 200-300 MG PO TABS
1.0000 | ORAL_TABLET | Freq: Every day | ORAL | Status: DC
  Administered 2012-02-04: 1 via ORAL
  Filled 2012-02-04 (×3): qty 1

## 2012-02-04 MED ORDER — ZOLPIDEM TARTRATE 5 MG PO TABS: 5.0000 mg | ORAL_TABLET | Freq: Every evening | ORAL | Status: DC | PRN

## 2012-02-04 MED ORDER — INSULIN GLARGINE 100 UNIT/ML ~~LOC~~ SOLN
25.0000 [IU] | Freq: Every day | SUBCUTANEOUS | Status: DC
  Administered 2012-02-04: 25 [IU] via SUBCUTANEOUS
  Filled 2012-02-04: qty 1

## 2012-02-04 MED ORDER — INSULIN ASPART 100 UNIT/ML ~~LOC~~ SOLN
0.0000 [IU] | Freq: Three times a day (TID) | SUBCUTANEOUS | Status: DC
  Administered 2012-02-04: 15 [IU] via SUBCUTANEOUS
  Administered 2012-02-04: 11 [IU] via SUBCUTANEOUS
  Administered 2012-02-05: 7 [IU] via SUBCUTANEOUS
  Filled 2012-02-04 (×3): qty 1

## 2012-02-04 MED ORDER — IBUPROFEN 200 MG PO TABS: 600.0000 mg | ORAL_TABLET | Freq: Three times a day (TID) | ORAL | Status: DC | PRN

## 2012-02-04 MED ORDER — ADULT MULTIVITAMIN W/MINERALS CH
1.0000 | ORAL_TABLET | Freq: Every day | ORAL | Status: DC
  Administered 2012-02-05: 1 via ORAL
  Filled 2012-02-04: qty 1

## 2012-02-04 MED ORDER — ACETAMINOPHEN 325 MG PO TABS: 650.0000 mg | ORAL_TABLET | ORAL | Status: DC | PRN

## 2012-02-04 NOTE — ED Notes (Signed)
No sitter available at present. Talked with charge nurse about need for sitter.

## 2012-02-04 NOTE — ED Provider Notes (Signed)
History     CSN: [id number]  Arrival date & time 02/04/12  0031   First MD Initiated Contact with Patient 02/04/12 0106      Chief Complaint  Patient presents with  . Palpitations    Patient is a 41 y.o. male presenting with palpitations. The history is provided by the patient. History Limited By: intoxicated.  Palpitations   Pt was seen at 0120.  Per pt, c/o gradual onset and persistence of constant palpitations that began approx 2-3 hours PTA.  States the palpitations began after he was using cocaine and drinking etoh.  Denies CP/SOB, no cough, no abd pain, no back pain, no N/V/D.    Past Medical History  Diagnosis Date  . Pneumonia   . Diabetes mellitus   . HIV positive   . MRSA (methicillin resistant Staphylococcus aureus)     Past Surgical History  Procedure Date  . Mrsa surgery     History  Substance Use Topics  . Smoking status: Current Everyday Smoker    Types: Cigarettes  . Smokeless tobacco: Not on file  . Alcohol Use: Yes     Review of Systems  Unable to perform ROS: Other    Allergies  Bactrim  Home Medications   Current Outpatient Rx  Name Route Sig Dispense Refill  . INSULIN ASPART 100 UNIT/ML [place] SOLN Subcutaneous Inject 10-25 Units into the skin 3 (three) times daily before meals. According to sliding scale    . INSULIN GLARGINE 100 UNIT/ML [place] SOLN Subcutaneous Inject 30 Units into the skin at bedtime.     Marland Kitchen RITONAVIR 100 MG PO CAPS Oral Take 100 mg by mouth daily.        BP 110/73  Pulse 103  Temp(Src) 98.2 F (36.8 C) (Oral)  Resp 24  SpO2 94%  Physical Exam 0125: Physical examination:  Nursing notes reviewed; Vital signs and O2 SAT reviewed;  Constitutional: Well developed, Well nourished, Well hydrated, In no acute distress; Head:  Normocephalic, atraumatic; Eyes: EOMI, PERRL, No scleral icterus; ENMT: Mouth and pharynx normal, Mucous membranes moist; Neck: Supple, Full range of motion, No lymphadenopathy; Cardiovascular: Regular  rate and rhythm, No murmur, rub, or gallop; Respiratory: Breath sounds clear & equal bilaterally, No rales, rhonchi, wheezes, or rub, Normal respiratory effort/excursion; Chest: Nontender, Movement normal; Abdomen: Soft, Nontender, Nondistended, Normal bowel sounds; Extremities: Pulses normal, No tenderness, No edema, No calf edema or asymmetry.; Neuro: AA&Ox3, Major CN grossly intact.  No gross focal motor or sensory deficits in extremities.; Skin: Color normal, Warm, Dry, no rash;  Psych:  +depression.    ED Course  Procedures   5:41 AM:  States to ED RN that he is "depressed" and that is why he "binge drinks and uses cocaine."  States he has vague SI, no plan.  Pt requesting eval by ACT team.  Pt's VS remain stable, HR 90's, NSR on monitor.     5:44 AM:  T/C to ACT, will come to ED for eval.   6:37 AM:  ACT has eval, pt apparently now has a plan to "jump in front of a truck."  Will hold in ED pending placement.    MDM  MDM Reviewed: previous chart, nursing note and vitals Reviewed previous: ECG Interpretation: ECG, labs and x-ray    Date: 02/04/2012  Rate: 103  Rhythm: sinus tachycardia  QRS Axis: left  Intervals: normal  ST/T Wave abnormalities: nonspecific ST/T changes V2-V3, V5-V6  Conduction Disutrbances:none  Narrative Interpretation:   Old EKG Reviewed:  unchanged; no significant changes from previous EKG dated 03/15/2010.   Results for orders placed during the hospital encounter of 02/04/12  BASIC METABOLIC PANEL      Component Value Range   Sodium 137  135 - 145 (mEq/L)   Potassium 3.5  3.5 - 5.1 (mEq/L)   Chloride 98  96 - 112 (mEq/L)   CO2 22  19 - 32 (mEq/L)   Glucose, Bld 233 (*) 70 - 99 (mg/dL)   BUN 10  6 - 23 (mg/dL)   Creatinine, Ser 1.61  0.50 - 1.35 (mg/dL)   Calcium 9.8  8.4 - 09.6 (mg/dL)   GFR calc non Af Amer >90  >90 (mL/min)   GFR calc Af Amer >90  >90 (mL/min)  CBC      Component Value Range   WBC 6.7  4.0 - 10.5 (K/uL)   RBC 4.64  4.22 - 5.81  (MIL/uL)   Hemoglobin 15.0  13.0 - 17.0 (g/dL)   HCT 04.5  40.9 - 81.1 (%)   MCV 90.9  78.0 - 100.0 (fL)   MCH 32.3  26.0 - 34.0 (pg)   MCHC 35.5  30.0 - 36.0 (g/dL)   RDW 91.4  78.2 - 95.6 (%)   Platelets 198  150 - 400 (K/uL)  DIFFERENTIAL      Component Value Range   Neutrophils Relative 39 (*) 43 - 77 (%)   Lymphocytes Relative 52 (*) 12 - 46 (%)   Monocytes Relative 6  3 - 12 (%)   Eosinophils Relative 2  0 - 5 (%)   Basophils Relative 1  0 - 1 (%)   Neutro Abs 2.6  1.7 - 7.7 (K/uL)   Lymphs Abs 3.5  0.7 - 4.0 (K/uL)   Monocytes Absolute 0.4  0.1 - 1.0 (K/uL)   Eosinophils Absolute 0.1  0.0 - 0.7 (K/uL)   Basophils Absolute 0.1  0.0 - 0.1 (K/uL)   Smear Review MORPHOLOGY UNREMARKABLE    URINE RAPID DRUG SCREEN (HOSP PERFORMED)      Component Value Range   Opiates NONE DETECTED  NONE DETECTED    Cocaine POSITIVE (*) NONE DETECTED    Benzodiazepines NONE DETECTED  NONE DETECTED    Amphetamines NONE DETECTED  NONE DETECTED    Tetrahydrocannabinol NONE DETECTED  NONE DETECTED    Barbiturates NONE DETECTED  NONE DETECTED   ETHANOL      Component Value Range   Alcohol, Ethyl (B) 48 (*) 0 - 11 (mg/dL)  TROPONIN I      Component Value Range   Troponin I <0.30  <0.30 (ng/mL)   Dg Chest 2 View 02/04/2012  *RADIOLOGY REPORT*  Clinical Data: Cough.  Rule out infiltrate.  CHF.  Query free air. Left breast chest pain tonight.  CHEST - 2 VIEW  Comparison: 10/23/2011  Findings: Slightly shallow inspiration.  Normal heart size and pulmonary vascularity.  Mild peribronchial thickening suggesting chronic bronchitis.  No focal airspace consolidation.  No blunting of costophrenic angles.  No pneumothorax.  Mild thoracic scoliosis convex towards the right.  Stable appearance since previous study.  IMPRESSION: Chronic bronchitic changes.  No evidence of active pulmonary disease.  Original Report Authenticated By: Marlon Pel, M.D.                 Laray Anger,  DO 02/06/12 [phone]

## 2012-02-04 NOTE — ED Notes (Addendum)
Patient with palpitations that started 2-3 hours ago, with chest pain that started in the ambulance.  No shortness of breath, no nausea or vomiting.  Patient does admit to cocaine use and alcohol tonight.

## 2012-02-04 NOTE — ED Notes (Signed)
Pt ate most of his breakfast, watching tv at present time. No sitter available at this time.

## 2012-02-04 NOTE — ED Notes (Signed)
Md at bedside

## 2012-02-04 NOTE — BH Assessment (Signed)
Assessment Note   Kyle Young is an 41 y.o. male who presented to the ED after abusing cocaine earlier this evening.  He reports he was binging to help cope with his depression which has become more overwhelming lately.  [name] reports that he has recently started to think about jumping off of an overpass that he crosses over daily.  He states that he is feeling more and more hopeless and worthless and feels that he needs some help to "get right."  He is motivated for treatment and feels uncertain of his ability to maintain his safety outside of the hospital.  Pt information submitted to [facility] for review for inpatient admission.  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 Pneumonia   . Diabetes mellitus   . HIV positive   . MRSA (methicillin resistant Staphylococcus aureus)    Axis IV: other psychosocial or environmental problems and problems related to legal system/crime Axis V: 41-50 serious symptoms  Past Medical History:  Past Medical History  Diagnosis Date  . Pneumonia   . Diabetes mellitus   . HIV positive   . MRSA (methicillin resistant Staphylococcus aureus)     Past Surgical History  Procedure Date  . Mrsa surgery     Family History: No family history on file.  Social History:  reports that he has been smoking Cigarettes.  He does not have any smokeless tobacco history on file. He reports that he drinks alcohol. He reports that he uses illicit drugs (Cocaine).  Additional Social History:  Alcohol / Drug Use History of alcohol / drug use?: Yes Substance #1 Name of Substance 1: Alcohol-Usually beer 1 - Age of First Use: 8 1 - Amount (size/oz): 40oz 1 - Frequency: daily 1 - Duration: ongoing 1 - Last Use / Amount: 02/03/12 80 oz beer; liter of wine Substance #2 Name of Substance 2: Cocaine 2 - Age of First Use: 22 2 - Amount (size/oz): $300-400 2 - Frequency: binges  every other week for several days 2 - Duration: months 2 - Last Use / Amount: 3/29/ $400 Allergies:  Allergies  Allergen Reactions  . Bactrim Rash and Other (See Comments)    Fainting     Home Medications:  Medications Prior to Admission  Medication Dose Route Frequency Provider Last Rate Last Dose  . 0.9 %  sodium chloride infusion   Intravenous Continuous Laray Anger, DO 125 mL/hr at 02/04/12 0157    . LORazepam (ATIVAN) tablet 1 mg  1 mg Oral Once Laray Anger, DO   1 mg at 02/04/12 0155  . sodium chloride 0.9 % bolus 500 mL  500 mL Intravenous Once Laray Anger, DO   1,000 mL at 02/04/12 0157   Medications Prior to Admission  Medication Sig Dispense Refill  . insulin aspart (NOVOLOG FLEXPEN) 100 UNIT/ML injection Inject 10-25 Units into the skin 3 (three) times daily before meals. According to sliding scale      . insulin glargine (LANTUS SOLOSTAR) 100 UNIT/ML injection Inject 30 Units into the skin at bedtime.       . ritonavir (NORVIR) 100 MG capsule Take 100 mg by mouth daily.          OB/GYN Status:  No LMP for male patient.  General Assessment Data Location of Assessment: [facility] ED Can pt return to current living arrangement?: Yes Admission Status: Voluntary Is patient capable of signing  voluntary admission?: Yes Transfer from: Acute Hospital Referral Source: Self/Family/Friend  Education Status Is patient currently in school?: No  Risk to self Suicidal Ideation: Yes-Currently Present Suicidal Intent: No-Not Currently/Within Last 6 Months Is patient at risk for suicide?: Yes Suicidal Plan?: Yes-Currently Present Specify Current Suicidal Plan: jump off bridge Access to Means: Yes Specify Access to Suicidal Means: environmental What has been your use of drugs/alcohol within the last 12 months?: binging to cope with depression Previous Attempts/Gestures: No Other Self Harm Risks: impulsive, hopeless Intentional Self Injurious Behavior: None Family  Suicide History: Yes (brother attempted) Recent stressful life event(s): Other (Comment) (worsening depression) Persecutory voices/beliefs?: No Depression: Yes Depression Symptoms: Despondent;Insomnia;Isolating;Fatigue;Guilt;Loss of interest in usual pleasures;Feeling worthless/self pity;Feeling angry/irritable Substance abuse history and/or treatment for substance abuse?: Yes Suicide prevention information given to non-admitted patients: Not applicable  Risk to Others Homicidal Ideation: No Thoughts of Harm to Others: No Current Homicidal Intent: No Current Homicidal Plan: No Access to Homicidal Means: No History of harm to others?: No Assessment of Violence: None Noted Does patient have access to weapons?: No Criminal Charges Pending?: Yes Describe Pending Criminal Charges: unauthorized use of a vehicle Does patient have a court date: Yes Court Date: 03/12/12  Psychosis Hallucinations: None noted Delusions: None noted  Mental Status Report Appear/Hygiene: Improved Eye Contact: Fair Motor Activity: Freedom of movement Speech: Soft Level of Consciousness: Quiet/awake Mood: Depressed Affect: Appropriate to circumstance;Depressed Anxiety Level: Moderate Thought Processes: Coherent;Relevant Judgement: Impaired Orientation: Person;Time;Place;Situation Obsessive Compulsive Thoughts/Behaviors: Moderate  Cognitive Functioning Concentration: Normal Memory: Recent Intact;Remote Intact IQ: Average Insight: Fair Impulse Control: Poor Appetite: Poor Sleep: Decreased Total Hours of Sleep: 5  Vegetative Symptoms: Staying in bed  Prior Inpatient Therapy Prior Inpatient Therapy: No  Prior Outpatient Therapy Prior Outpatient Therapy: No  ADL Screening (condition at time of admission) Patient's cognitive ability adequate to safely complete daily activities?: Yes Patient able to express need for assistance with ADLs?: Yes Independently performs ADLs?: Yes         Abuse/Neglect Assessment (Assessment to be complete while patient is alone) Physical Abuse: Denies Verbal Abuse: Denies Sexual Abuse: Denies Exploitation of patient/patient's resources: Denies Self-Neglect: Denies Values / Beliefs Cultural Requests During Hospitalization: None Spiritual Requests During Hospitalization: None   Advance Directives (For Healthcare) Advance Directive: Patient does not have advance directive Nutrition Screen Diet: Regular  Additional Information 1:1 In Past 12 Months?: No CIRT Risk: No Elopement Risk: No Does patient have medical clearance?: No  Child/Adolescent Assessment Running Away Risk: Denies Bed-Wetting: Denies  Disposition:  Disposition Disposition of Patient: Inpatient treatment program;Referred to [facility] for review) Type of inpatient treatment program: Adult Patient referred to: Other (Comment) [facility] For review)  On Site Evaluation by:   Reviewed with Physician:     Steward Ros 02/04/2012 6:42 AM

## 2012-02-04 NOTE — ED Notes (Signed)
Patient reports that he was having palpitations this evening after using cocaine.  Patient states that he did have minimal chest pain, no shortness of breath.  Patient states that he does not use often.  Patient also with ETOH on board tonight.  Patient did admit to having some suicidal thoughts, no plans at this time and requested that he be evaluated to possibly have an admission to [facility].  MD aware.

## 2012-02-04 NOTE — ED Provider Notes (Addendum)
BP 103/57  Pulse 94  Temp(Src) 98.2 F (36.8 C) (Oral)  Resp 28  SpO2 96%  Pt asx at this time. SSI ordered + home dose lantus. Holding orders complete. HIV medications ordered.   See Dr. Richrd Prime' incomplete note below:  CSN: [id number]  Arrival date & time 02/04/12 0031  First MD Initiated Contact with Patient 02/04/12 0106  Chief Complaint   Patient presents with   .  Palpitations    Patient is a 41 y.o. male presenting with palpitations. The history is provided by the patient. History Limited By: intoxicated.  Palpitations  Pt was seen at 0120. Per pt, c/o gradual onset and persistence of constant palpitations that began approx 2-3 hours PTA. States the palpitations began after he was using cocaine and drinking etoh. Denies CP/SOB, no cough, no abd pain, no back pain, no N/V/D.  Past Medical History   Diagnosis  Date   .  Pneumonia    .  Diabetes mellitus    .  HIV positive    .  MRSA (methicillin resistant Staphylococcus aureus)     Past Surgical History   Procedure  Date   .  Mrsa surgery     History   Substance Use Topics   .  Smoking status:  Current Everyday Smoker     Types:  Cigarettes   .  Smokeless tobacco:  Not on file   .  Alcohol Use:  Yes     Review of Systems  Unable to perform ROS: Other   Allergies   Bactrim  Home Medications    Current Outpatient Rx   Name  Route  Sig  Dispense  Refill   .  INSULIN ASPART 100 UNIT/ML [place] SOLN  Subcutaneous  Inject 10-25 Units into the skin 3 (three) times daily before meals. According to sliding scale     .  INSULIN GLARGINE 100 UNIT/ML [place] SOLN  Subcutaneous  Inject 30 Units into the skin at bedtime.     Marland Kitchen  RITONAVIR 100 MG PO CAPS  Oral  Take 100 mg by mouth daily.      BP 110/73  Pulse 103  Temp(Src) 98.2 F (36.8 C) (Oral)  Resp 24  SpO2 94%  Physical Exam  0125:  Physical examination: Nursing notes reviewed; Vital signs and O2 SAT reviewed; Constitutional: Well developed, Well nourished, Well  hydrated, In no acute distress; Head: Normocephalic, atraumatic; Eyes: EOMI, PERRL, No scleral icterus; ENMT: Mouth and pharynx normal, Mucous membranes moist; Neck: Supple, Full range of motion, No lymphadenopathy; Cardiovascular: Regular rate and rhythm, No murmur, rub, or gallop; Respiratory: Breath sounds clear & equal bilaterally, No rales, rhonchi, wheezes, or rub, Normal respiratory effort/excursion; Chest: Nontender, Movement normal; Abdomen: Soft, Nontender, Nondistended, Normal bowel sounds; Extremities: Pulses normal, No tenderness, No edema, No calf edema or asymmetry.; Neuro: AA&Ox3, Major CN grossly intact. No gross focal motor or sensory deficits in extremities.; Skin: Color normal, Warm, Dry, no rash; Psych: +depression.  ED Course   Procedures  5:41 AM: States to ED RN that he is "depressed" and that is why he "binge drinks and uses cocaine." States he has vague SI, no plan. Pt requesting eval by ACT team. Pt's VS remain stable, HR 90's, NSR on monitor.  5:44 AM: T/C to ACT, will come to ED for eval.  6:37 AM: ACT has eval, pt apparently now has a plan to "jump in front of a truck." Will hold in ED pending placement.  MDM   MDM  Reviewed: previous chart, nursing note and vitals Reviewed previous: ECG Interpretation: ECG, labs and x-ray  Date: 02/04/2012  Rate: 103  Rhythm: sinus tachycardia  QRS Axis: left  Intervals: normal  ST/T Wave abnormalities: nonspecific ST/T changes V2-V3, V5-V6  Conduction Disutrbances:none  Narrative Interpretation:  Old EKG Reviewed: unchanged; no significant changes from previous EKG dated 03/15/2010.  Results for orders placed during the hospital encounter of 02/04/12   BASIC METABOLIC PANEL   Component  Value  Range    Sodium  137  135 - 145 (mEq/L)    Potassium  3.5  3.5 - 5.1 (mEq/L)    Chloride  98  96 - 112 (mEq/L)    CO2  22  19 - 32 (mEq/L)    Glucose, Bld  233 (*)  70 - 99 (mg/dL)    BUN  10  6 - 23 (mg/dL)    Creatinine, Ser  1.93   0.50 - 1.35 (mg/dL)    Calcium  9.8  8.4 - 10.5 (mg/dL)    GFR calc non Af Amer  >90  >90 (mL/min)    GFR calc Af Amer  >90  >90 (mL/min)   CBC   Component  Value  Range    WBC  6.7  4.0 - 10.5 (K/uL)    RBC  4.64  4.22 - 5.81 (MIL/uL)    Hemoglobin  15.0  13.0 - 17.0 (g/dL)    HCT  79.0  24.0 - 97.3 (%)    MCV  90.9  78.0 - 100.0 (fL)    MCH  32.3  26.0 - 34.0 (pg)    MCHC  35.5  30.0 - 36.0 (g/dL)    RDW  53.2  99.2 - 42.6 (%)    Platelets  198  150 - 400 (K/uL)   DIFFERENTIAL   Component  Value  Range    Neutrophils Relative  39 (*)  43 - 77 (%)    Lymphocytes Relative  52 (*)  12 - 46 (%)    Monocytes Relative  6  3 - 12 (%)    Eosinophils Relative  2  0 - 5 (%)    Basophils Relative  1  0 - 1 (%)    Neutro Abs  2.6  1.7 - 7.7 (K/uL)    Lymphs Abs  3.5  0.7 - 4.0 (K/uL)    Monocytes Absolute  0.4  0.1 - 1.0 (K/uL)    Eosinophils Absolute  0.1  0.0 - 0.7 (K/uL)    Basophils Absolute  0.1  0.0 - 0.1 (K/uL)    Smear Review  MORPHOLOGY UNREMARKABLE    URINE RAPID DRUG SCREEN (HOSP PERFORMED)   Component  Value  Range    Opiates  NONE DETECTED  NONE DETECTED    Cocaine  POSITIVE (*)  NONE DETECTED    Benzodiazepines  NONE DETECTED  NONE DETECTED    Amphetamines  NONE DETECTED  NONE DETECTED    Tetrahydrocannabinol  NONE DETECTED  NONE DETECTED    Barbiturates  NONE DETECTED  NONE DETECTED   ETHANOL   Component  Value  Range    Alcohol, Ethyl (B)  48 (*)  0 - 11 (mg/dL)   TROPONIN I   Component  Value  Range    Troponin I  <0.30  <0.30 (ng/mL)    Dg Chest 2 View  02/04/2012 *RADIOLOGY REPORT* Clinical Data: Cough. Rule out infiltrate. CHF. Query free air. Left breast chest pain tonight. CHEST - 2 VIEW  Comparison: 10/23/2011 Findings: Slightly shallow inspiration. Normal heart size and pulmonary vascularity. Mild peribronchial thickening suggesting chronic bronchitis. No focal airspace consolidation. No blunting of costophrenic angles. No pneumothorax. Mild thoracic scoliosis  convex towards the right. Stable appearance since previous study. IMPRESSION: Chronic bronchitic changes. No evidence of active pulmonary disease. Original Report Authenticated By: Marlon Pel, M.D.       Forbes Cellar, MD 02/04/12 1222  Forbes Cellar, MD 02/04/12 [phone]

## 2012-02-04 NOTE — ED Notes (Signed)
Non-sharp, carb modified diet tray ordered.

## 2012-02-04 NOTE — BH Assessment (Signed)
Assessment Note   Oneita Hurt, assessment counselor at [facility] ED, requested Pt be submitted for admission to [facility]. Serena Colonel, NP reviewed clinical information and accepted Pt for inpatient treatment. Consulted with Theodoro Kos, [facility] who said an appropriate bed is not available at this time. Notified Christien Herbie Baltimore, ACT counselor, of disposition.   Patsy Baltimore, Harlin Rain 02/04/2012 2:34 PM

## 2012-02-04 NOTE — ED Notes (Signed)
Pt given sandwich to take with antiretroviral meds as pt gets nauseous with meds

## 2012-02-05 ENCOUNTER — Inpatient Hospital Stay (HOSPITAL_COMMUNITY)
Admission: AD | Admit: 2012-02-05 | Discharge: 2012-02-09 | DRG: 897 | Disposition: A | Discharge status: Home / Self Care | Payer: Managed Care, Other (non HMO) | Attending: Psychiatry | Admitting: Psychiatry

## 2012-02-05 ENCOUNTER — Encounter (HOSPITAL_COMMUNITY): Payer: Self-pay | Admitting: *Deleted

## 2012-02-05 DIAGNOSIS — Z87898 Personal history of other specified conditions: Secondary | ICD-10-CM | POA: Diagnosis present

## 2012-02-05 DIAGNOSIS — Z794 Long term (current) use of insulin: Secondary | ICD-10-CM

## 2012-02-05 DIAGNOSIS — F1010 Alcohol abuse, uncomplicated: Principal | ICD-10-CM

## 2012-02-05 DIAGNOSIS — Z79899 Other long term (current) drug therapy: Secondary | ICD-10-CM

## 2012-02-05 DIAGNOSIS — B20 Human immunodeficiency virus [HIV] disease: Secondary | ICD-10-CM | POA: Diagnosis present

## 2012-02-05 DIAGNOSIS — F329 Major depressive disorder, single episode, unspecified: Secondary | ICD-10-CM

## 2012-02-05 DIAGNOSIS — Z8614 Personal history of Methicillin resistant Staphylococcus aureus infection: Secondary | ICD-10-CM

## 2012-02-05 DIAGNOSIS — F1410 Cocaine abuse, uncomplicated: Secondary | ICD-10-CM | POA: Diagnosis present

## 2012-02-05 DIAGNOSIS — E119 Type 2 diabetes mellitus without complications: Secondary | ICD-10-CM | POA: Diagnosis present

## 2012-02-05 DIAGNOSIS — Z888 Allergy status to other drugs, medicaments and biological substances status: Secondary | ICD-10-CM

## 2012-02-05 DIAGNOSIS — F1994 Other psychoactive substance use, unspecified with psychoactive substance-induced mood disorder: Secondary | ICD-10-CM | POA: Diagnosis present

## 2012-02-05 DIAGNOSIS — Z21 Asymptomatic human immunodeficiency virus [HIV] infection status: Secondary | ICD-10-CM

## 2012-02-05 HISTORY — DX: Depression, unspecified: F32.A

## 2012-02-05 HISTORY — DX: Major depressive disorder, single episode, unspecified: F32.9

## 2012-02-05 HISTORY — DX: Mental disorder, not otherwise specified: F99

## 2012-02-05 LAB — GLUCOSE, CAPILLARY
Glucose-Capillary: 224 mg/dL — ABNORMAL HIGH (ref 70–99)
Glucose-Capillary: 262 mg/dL — ABNORMAL HIGH (ref 70–99)
Glucose-Capillary: 339 mg/dL — ABNORMAL HIGH (ref 70–99)

## 2012-02-05 MED ORDER — INSULIN GLARGINE 100 UNIT/ML ~~LOC~~ SOLN
25.0000 [IU] | Freq: Every day | SUBCUTANEOUS | Status: DC
  Administered 2012-02-05 – 2012-02-07 (×3): 25 [IU] via SUBCUTANEOUS

## 2012-02-05 MED ORDER — ACETAMINOPHEN 325 MG PO TABS
650.0000 mg | ORAL_TABLET | Freq: Four times a day (QID) | ORAL | Status: DC | PRN
  Administered 2012-02-06: 650 mg via ORAL

## 2012-02-05 MED ORDER — NICOTINE 14 MG/24HR TD PT24
14.0000 mg | MEDICATED_PATCH | Freq: Every day | TRANSDERMAL | Status: DC
  Administered 2012-02-06 – 2012-02-09 (×4): 14 mg via TRANSDERMAL
  Filled 2012-02-05 (×6): qty 1

## 2012-02-05 MED ORDER — INSULIN ASPART 100 UNIT/ML ~~LOC~~ SOLN
0.0000 [IU] | Freq: Three times a day (TID) | SUBCUTANEOUS | Status: DC
  Administered 2012-02-05: 11 [IU] via SUBCUTANEOUS
  Administered 2012-02-06: 7 [IU] via SUBCUTANEOUS
  Administered 2012-02-06 (×2): 11 [IU] via SUBCUTANEOUS
  Administered 2012-02-07: 7 [IU] via SUBCUTANEOUS
  Administered 2012-02-07: 20 [IU] via SUBCUTANEOUS
  Administered 2012-02-07 – 2012-02-08 (×3): 11 [IU] via SUBCUTANEOUS

## 2012-02-05 MED ORDER — TRAZODONE HCL 100 MG PO TABS
100.0000 mg | ORAL_TABLET | Freq: Every day | ORAL | Status: DC
  Administered 2012-02-05 – 2012-02-08 (×4): 100 mg via ORAL
  Filled 2012-02-05 (×5): qty 1

## 2012-02-05 MED ORDER — INSULIN ASPART 100 UNIT/ML ~~LOC~~ SOLN
0.0000 [IU] | Freq: Every day | SUBCUTANEOUS | Status: DC
  Administered 2012-02-05: 4 [IU] via SUBCUTANEOUS
  Administered 2012-02-06: 3 [IU] via SUBCUTANEOUS
  Administered 2012-02-07: 4 [IU] via SUBCUTANEOUS

## 2012-02-05 MED ORDER — RITONAVIR 100 MG PO TABS
100.0000 mg | ORAL_TABLET | Freq: Every day | ORAL | Status: DC
  Administered 2012-02-05 – 2012-02-08 (×4): 100 mg via ORAL
  Filled 2012-02-05 (×5): qty 1

## 2012-02-05 MED ORDER — MAGNESIUM HYDROXIDE 400 MG/5ML PO SUSP
30.0000 mL | Freq: Every day | ORAL | Status: DC | PRN
Start: 2012-02-05 — End: 2012-02-09

## 2012-02-05 MED ORDER — EMTRICITABINE-TENOFOVIR DF 200-300 MG PO TABS
1.0000 | ORAL_TABLET | Freq: Every day | ORAL | Status: DC
  Administered 2012-02-05 – 2012-02-08 (×4): 1 via ORAL
  Filled 2012-02-05 (×5): qty 1

## 2012-02-05 MED ORDER — HYDROXYZINE HCL 25 MG PO TABS: 25.0000 mg | ORAL_TABLET | Freq: Every evening | ORAL | Status: DC | PRN

## 2012-02-05 MED ORDER — ATAZANAVIR SULFATE 150 MG PO CAPS
300.0000 mg | ORAL_CAPSULE | Freq: Every day | ORAL | Status: DC
  Administered 2012-02-05 – 2012-02-08 (×4): 300 mg via ORAL
  Filled 2012-02-05 (×5): qty 2

## 2012-02-05 MED ORDER — ALUM & MAG HYDROXIDE-SIMETH 200-200-20 MG/5ML PO SUSP: 30.0000 mL | ORAL | Status: DC | PRN

## 2012-02-05 NOTE — Progress Notes (Signed)
Report received from Leana Roe RN, Clinical research associate observed patient lying in bed asleep eyes closed resp. even and unlabored, no signs of distress noted, safety maintained on unit, will continue to monitor.

## 2012-02-05 NOTE — Progress Notes (Signed)
Patient ID: Kyle Young, male   DOB: 1970/11/30, 41 y.o.   MRN: [id number] Pt admitted on voluntary basis, pt states that he used cocaine the other day, had heart palpitations and went to the ED to have the palpitations evaluated. Pt then stated while there he told about having depression and wanting help for his depression. Pt does admit to alcohol use as well as cocaine use and stated that he binges on cocaine every couple weeks and has been doing so for about 15 years. Pt also stated that he drinks but last drink was 3 days ago but denies regular use. Pt states his depression comes from being HIV +, having a job loss and a death in the family in the past year. Pt states that he has never had any treatment for his depression in the past and has never been on any medications for depression. Pt currently lives with wife and plans to go back there at discharge. Pt does have various scars on both legs and feet as well as abdomen and states they are from MRSA, pt states that he does not have any active MRSA, there were no open sores noted on admission and has stated that since he has HIV under control he does not have any issues with MRSA at this time. Pt is diabetic, states that he does not check his blood sugar on regular basis but does cover himself with novolog after meals depending on how much he eats. Pt does endorse depression on admission but denies any suicidal thoughts and is able to contract for safety on the unit.

## 2012-02-05 NOTE — ED Notes (Signed)
Pt received bed placement at [facility]. Report called to [facility], Charity fundraiser. Pt transported to facility via security with sitter. All belongings and paperwork sent with patient.

## 2012-02-05 NOTE — BH Assessment (Signed)
Assessment Note   Kyle Young is an 41 y.o. male.   Axis I: See current hospital problem list and Social Anxiety   Past Medical History:  Past Medical History  Diagnosis Date  . Pneumonia   . Diabetes mellitus   . HIV positive   . MRSA (methicillin resistant Staphylococcus aureus)     Past Surgical History  Procedure Date  . Mrsa surgery     Family History: No family history on file.  Social History:  reports that he has been smoking Cigarettes.  He does not have any smokeless tobacco history on file. He reports that he drinks alcohol. He reports that he uses illicit drugs (Cocaine).  Additional Social History:  Alcohol / Drug Use History of alcohol / drug use?: Yes Substance #1 Name of Substance 1: Alcohol-Usually beer 1 - Age of First Use: 8 1 - Amount (size/oz): 40oz 1 - Frequency: daily 1 - Duration: ongoing 1 - Last Use / Amount: 02/03/12 80 oz beer; liter of wine Substance #2 Name of Substance 2: Cocaine 2 - Age of First Use: 22 2 - Amount (size/oz): $300-400 2 - Frequency: binges every other week for several days 2 - Duration: months 2 - Last Use / Amount: 3/29/ $400 Allergies:  Allergies  Allergen Reactions  . Bactrim Rash and Other (See Comments)    Fainting     Home Medications:  Medications Prior to Admission  Medication Dose Route Frequency Provider Last Rate Last Dose  . 0.9 %  sodium chloride infusion   Intravenous Continuous Laray Anger, DO      . acetaminophen (TYLENOL) tablet 650 mg  650 mg Oral Q4H PRN Forbes Cellar, MD      . atazanavir (REYATAZ) capsule 300 mg  300 mg Oral QHS Forbes Cellar, MD   300 mg at 02/04/12 2158  . emtricitabine-tenofovir (TRUVADA) 200-300 MG per tablet 1 tablet  1 tablet Oral QHS Forbes Cellar, MD   1 tablet at 02/04/12 2158  . ibuprofen (ADVIL,MOTRIN) tablet 600 mg  600 mg Oral Q8H PRN Forbes Cellar, MD      . insulin aspart (novoLOG) injection 0-20 Units  0-20 Units Subcutaneous TID WC Forbes Cellar,  MD   7 Units at 02/05/12 1032  . insulin glargine (LANTUS) injection 25 Units  25 Units Subcutaneous QHS Forbes Cellar, MD   25 Units at 02/04/12 2158  . LORazepam (ATIVAN) tablet 1 mg  1 mg Oral Once Laray Anger, DO   1 mg at 02/04/12 0155  . LORazepam (ATIVAN) tablet 1 mg  1 mg Oral Q8H PRN Forbes Cellar, MD      . mulitivitamin with minerals tablet 1 tablet  1 tablet Oral Daily Forbes Cellar, MD   1 tablet at 02/05/12 1030  . nicotine (NICODERM CQ - dosed in mg/24 hours) patch 21 mg  21 mg Transdermal Daily Forbes Cellar, MD   21 mg at 02/05/12 1031  . ondansetron (ZOFRAN) tablet 4 mg  4 mg Oral Q8H PRN Forbes Cellar, MD      . ritonavir (NORVIR) tablet 100 mg  100 mg Oral QHS Forbes Cellar, MD   100 mg at 02/04/12 2158  . sodium chloride 0.9 % bolus 500 mL  500 mL Intravenous Once Laray Anger, DO   1,000 mL at 02/04/12 0157  . zolpidem (AMBIEN) tablet 5 mg  5 mg Oral QHS PRN Forbes Cellar, MD      . DISCONTD: insulin aspart (novoLOG) injection 0-15 Units  0-15 Units Subcutaneous TID WC Forbes Cellar, MD      . DISCONTD: insulin aspart (novoLOG) injection 0-5 Units  0-5 Units Subcutaneous QHS Forbes Cellar, MD       Medications Prior to Admission  Medication Sig Dispense Refill  . insulin aspart (NOVOLOG FLEXPEN) 100 UNIT/ML injection Inject 10-25 Units into the skin 3 (three) times daily before meals. According to sliding scale      . insulin glargine (LANTUS SOLOSTAR) 100 UNIT/ML injection Inject 30 Units into the skin at bedtime.       . ritonavir (NORVIR) 100 MG capsule Take 100 mg by mouth daily.          OB/GYN Status:  No LMP for male patient.  General Assessment Data Location of Assessment: [facility] ED Can pt return to current living arrangement?: Yes Admission Status: Voluntary Is patient capable of signing voluntary admission?: Yes Transfer from: Acute Hospital Referral Source: Self/Family/Friend  Education Status Is patient currently in school?:  No  Risk to self Suicidal Ideation: Yes-Currently Present Suicidal Intent: No-Not Currently/Within Last 6 Months Is patient at risk for suicide?: Yes Suicidal Plan?: Yes-Currently Present Specify Current Suicidal Plan: jump off bridge Access to Means: Yes Specify Access to Suicidal Means: environmental What has been your use of drugs/alcohol within the last 12 months?: binging to cope with depression Previous Attempts/Gestures: No Other Self Harm Risks: impulsive, hopeless Intentional Self Injurious Behavior: None Family Suicide History: Yes (brother attempted) Recent stressful life event(s): Other (Comment) (worsening depression) Persecutory voices/beliefs?: No Depression: Yes Depression Symptoms: Despondent;Insomnia;Isolating;Fatigue;Guilt;Loss of interest in usual pleasures;Feeling worthless/self pity;Feeling angry/irritable Substance abuse history and/or treatment for substance abuse?: Yes Suicide prevention information given to non-admitted patients: Not applicable  Risk to Others Homicidal Ideation: No Thoughts of Harm to Others: No Current Homicidal Intent: No Current Homicidal Plan: No Access to Homicidal Means: No History of harm to others?: No Assessment of Violence: None Noted Does patient have access to weapons?: No Criminal Charges Pending?: Yes Describe Pending Criminal Charges: unauthorized use of a vehicle Does patient have a court date: Yes Court Date: 03/12/12  Psychosis Hallucinations: None noted Delusions: None noted  Mental Status Report Appear/Hygiene: Improved Eye Contact: Fair Motor Activity: Unremarkable Speech: Soft Level of Consciousness: Quiet/awake Mood: Depressed Affect: Appropriate to circumstance;Depressed Anxiety Level: Moderate Thought Processes: Coherent;Relevant Judgement: Impaired Orientation: Person;Time;Place;Situation Obsessive Compulsive Thoughts/Behaviors: Moderate  Cognitive Functioning Concentration: Normal Memory:  Recent Intact;Remote Intact IQ: Average Insight: Fair Impulse Control: Poor Appetite: Poor Sleep: Decreased Total Hours of Sleep: 5  Vegetative Symptoms: Staying in bed  Prior Inpatient Therapy Prior Inpatient Therapy: No  Prior Outpatient Therapy Prior Outpatient Therapy: No  ADL Screening (condition at time of admission) Patient's cognitive ability adequate to safely complete daily activities?: Yes Patient able to express need for assistance with ADLs?: Yes Independently performs ADLs?: Yes       Abuse/Neglect Assessment (Assessment to be complete while patient is alone) Physical Abuse: Denies Verbal Abuse: Denies Sexual Abuse: Denies Exploitation of patient/patient's resources: Denies Self-Neglect: Denies Values / Beliefs Cultural Requests During Hospitalization: None Spiritual Requests During Hospitalization: None   Advance Directives (For Healthcare) Advance Directive: Patient does not have advance directive Nutrition Screen Diet: Regular  Additional Information 1:1 In Past 12 Months?: No CIRT Risk: No Elopement Risk: No Does patient have medical clearance?: No  Child/Adolescent Assessment Running Away Risk: Denies Bed-Wetting: Denies  Disposition:  Disposition Disposition of Patient: Inpatient treatment program;Referred to [facility] for review) Type of inpatient treatment program: Adult Patient referred  to: Other (Comment) [facility] For review)  On Site Evaluation by:   Reviewed with Physician:  Accepted by Alessandra Grout to [facility] bed 302-1.  Pt. Completed Support Paperwork.  ACT staff informed medical staff of admission.     Barbaraann Boys 02/05/2012 11:09 AM

## 2012-02-06 DIAGNOSIS — F1410 Cocaine abuse, uncomplicated: Secondary | ICD-10-CM

## 2012-02-06 DIAGNOSIS — F1010 Alcohol abuse, uncomplicated: Principal | ICD-10-CM

## 2012-02-06 LAB — GLUCOSE, CAPILLARY
Glucose-Capillary: 257 mg/dL — ABNORMAL HIGH (ref 70–99)
Glucose-Capillary: 268 mg/dL — ABNORMAL HIGH (ref 70–99)

## 2012-02-06 MED ORDER — HYDROXYZINE HCL 25 MG PO TABS: 25.0000 mg | ORAL_TABLET | Freq: Four times a day (QID) | ORAL | Status: AC | PRN

## 2012-02-06 MED ORDER — CHLORDIAZEPOXIDE HCL 25 MG PO CAPS
25.0000 mg | ORAL_CAPSULE | Freq: Three times a day (TID) | ORAL | Status: AC
  Administered 2012-02-08 (×3): 25 mg via ORAL
  Filled 2012-02-06 (×4): qty 1

## 2012-02-06 MED ORDER — CHLORDIAZEPOXIDE HCL 25 MG PO CAPS: 25.0000 mg | ORAL_CAPSULE | Freq: Every day | ORAL | Status: DC

## 2012-02-06 MED ORDER — CHLORDIAZEPOXIDE HCL 25 MG PO CAPS
25.0000 mg | ORAL_CAPSULE | Freq: Four times a day (QID) | ORAL | Status: AC
  Administered 2012-02-06 – 2012-02-07 (×6): 25 mg via ORAL
  Filled 2012-02-06 (×5): qty 1

## 2012-02-06 MED ORDER — CHLORDIAZEPOXIDE HCL 25 MG PO CAPS
25.0000 mg | ORAL_CAPSULE | ORAL | Status: DC
  Administered 2012-02-09: 25 mg via ORAL
  Filled 2012-02-06: qty 1

## 2012-02-06 MED ORDER — ONDANSETRON 4 MG PO TBDP: 4.0000 mg | ORAL_TABLET | Freq: Four times a day (QID) | ORAL | Status: AC | PRN

## 2012-02-06 MED ORDER — LOPERAMIDE HCL 2 MG PO CAPS: 2.0000 mg | ORAL_CAPSULE | ORAL | Status: AC | PRN

## 2012-02-06 MED ORDER — VITAMIN B-1 100 MG PO TABS
100.0000 mg | ORAL_TABLET | Freq: Every day | ORAL | Status: DC
  Administered 2012-02-07 – 2012-02-09 (×3): 100 mg via ORAL
  Filled 2012-02-06 (×4): qty 1

## 2012-02-06 MED ORDER — THIAMINE HCL 100 MG/ML IJ SOLN: 100.0000 mg | Freq: Once | INTRAMUSCULAR | Status: DC

## 2012-02-06 MED ORDER — ADULT MULTIVITAMIN W/MINERALS CH
1.0000 | ORAL_TABLET | Freq: Every day | ORAL | Status: DC
  Administered 2012-02-06 – 2012-02-09 (×4): 1 via ORAL
  Filled 2012-02-06 (×5): qty 1

## 2012-02-06 MED ORDER — CHLORDIAZEPOXIDE HCL 25 MG PO CAPS: 25.0000 mg | ORAL_CAPSULE | Freq: Four times a day (QID) | ORAL | Status: AC | PRN

## 2012-02-06 NOTE — H&P (Signed)
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 Is a 41 yr. Old AAM who presented to the [facility] intoxicated complaining of racing heart.  He states he was binging on cocaine and alcohol to combat his increasing depression.  Lately he reports thinking about jumping off of a bridge.  He reports feeling more and more hopeless and helpless lately and feels that he needs help "getting it right." He has been using 400-500$ of cocaine per day, and 80oz of beer or a liter of wine each day.  Psychiatric Symptoms:  depressed mood, feelings of worthlessness/guilt, hopelessness, Hx of Trauma: (Emotional/Phsycial/Sexual) Past Psychiatric History: Past Medical History:   Past Medical History  Diagnosis Date  . Pneumonia   . Diabetes mellitus   . HIV positive   . MRSA (methicillin resistant Staphylococcus aureus)   . Mental disorder   . Depression    Allergies:   Allergies  Allergen Reactions  . Bactrim Rash and Other (See Comments)    Fainting     PTA Medications: Prescriptions prior to admission  Medication Sig Dispense Refill  . atazanavir (REYATAZ) 300 MG capsule Take 300 mg by mouth daily with breakfast.      . emtricitabine-tenofovir (TRUVADA) 200-300 MG per tablet Take 1 tablet by mouth daily.      . insulin aspart (NOVOLOG FLEXPEN) 100 UNIT/ML injection Inject 10-25 Units into the skin 3 (three) times daily before meals. According to sliding scale      . insulin glargine (LANTUS SOLOSTAR) 100 UNIT/ML injection Inject 30 Units into the skin at bedtime.       . ritonavir (NORVIR) 100 MG capsule Take 100 mg by mouth daily.          Previous Psychotropic Medications:  Substance Abuse History in the last 12 months:  Social History: Current Place of Residence:   Place of Birth:   Employment: Marital Status:   Single Children: Education:   Hotel manager History:  None. Legal History: Family History:  No family history on file.  ROS: As noted in the HPI. PE: Completed in ED. Pt evaluated and results reviewed.  Mental Status Examination/Evaluation: Appearance: Disheveled  Eye Contact::  Poor  Speech:  Slow  Volume:  Decreased  Mood:  Depressed, Hopeless and Worthless  Affect:  Congruent  Thought Process:  Circumstantial and Linear  Orientation:  Full  Thought Content:  WDL  Suicidal Thoughts:  Yes.  with intent/plan  Homicidal Thoughts:  No  Memory:  Immediate;   Fair  Judgement:  Impaired  Insight:  Fair  Psychomotor Activity:  Normal  Concentration:  Poor  Recall:  Poor  Akathisia:  No  Handed:    AIMS (if indicated):     Assets:  Desire for Improvement  Sleep:  Number of Hours: 6    Labs:Results for Kyle Young, Kyle Young (MRN [id number]) as of 02/06/2012 13:51  Ref. Range 02/04/2012 04:27  Amphetamines Latest Range: NONE DETECTED  NONE DETECTED  Barbiturates Latest Range: NONE DETECTED  NONE DETECTED  Benzodiazepines Latest Range: NONE DETECTED  NONE DETECTED  Opiates Latest Range: NONE DETECTED  NONE DETECTED  COCAINE Latest Range: NONE DETECTED  POSITIVE (A)  Tetrahydrocannabinol Latest Range: NONE DETECTED  NONE DETECTED  Results for Kyle Young, Kyle Young (MRN [id number]) as of 02/06/2012 13:51  Ref. Range 01/24/2011 18:10  Sample type No range found ARTERIAL DRAW  Delivery systems No  range found NASAL CANNULA  O2 Content No range found 2.0  pH, Arterial Latest Range: 7.350-7.450  7.424  pCO2 arterial Latest Range: 35.0-45.0 mmHg 42.4  pO2, Arterial Latest Range: 80.0-100.0 mmHg 59.0 (L)  Bicarbonate Latest Range: 20.0-24.0 mEq/L 27.3 (H)  TCO2 Latest Range: 0-100 mmol/L 23.9  Acid-Base Excess Latest Range: 0.0-2.0 mmol/L 3.0 (H)  O2 Saturation No range found 91.0  Patient temperature No range found 98.6  Collection site No range found LEFT RADIAL  Allens test (pass/fail) Latest Range:  PASS  PASS   Xray:  AXIS I:  Alcohol abuse, cocaine abuse, r/o SIMDO AXIS II:  Deferred AXIS III:   Past Medical History  Diagnosis Date  . Pneumonia   . Diabetes mellitus   . HIV positive   . MRSA (methicillin resistant Staphylococcus aureus)   . Mental disorder   . Depression    AXIS IV:  problems with access to health care services AXIS V:  51-60 moderate symptoms  Treatment Plan/Recommendations: Admit for crisis stabilization and supportive care to include detox protocol for alcohol dependence, opiate dependence, benzodiazepine dependence as needed. Evaluation and treatment for medical problems associated with current state of health.  Treatment Plan Summary: Daily contact with patient to assess and evaluate symptoms and progress in treatment Medication management Current Medications:  Current Facility-Administered Medications  Medication Dose Route Frequency Provider Last Rate Last Dose  . acetaminophen (TYLENOL) tablet 650 mg  650 mg Oral Q6H PRN Sanjuana Kava, NP      . alum & mag hydroxide-simeth (MAALOX/MYLANTA) 200-200-20 MG/5ML suspension 30 mL  30 mL Oral Q4H PRN Sanjuana Kava, NP      . atazanavir (REYATAZ) capsule 300 mg  300 mg Oral QHS Wonda Cerise, MD   300 mg at 02/05/12 2218  . emtricitabine-tenofovir (TRUVADA) 200-300 MG per tablet 1 tablet  1 tablet Oral QHS Wonda Cerise, MD   1 tablet at 02/05/12 2217  . hydrOXYzine (ATARAX/VISTARIL) tablet 25 mg  25 mg Oral QHS PRN Sanjuana Kava, NP      . insulin aspart (novoLOG) injection 0-20 Units  0-20 Units Subcutaneous TID WC Wonda Cerise, MD   11 Units at 02/06/12 1202  . insulin aspart (novoLOG) injection 0-5 Units  0-5 Units Subcutaneous QHS Wonda Cerise, MD   4 Units at 02/05/12 2219  . insulin glargine (LANTUS) injection 25 Units  25 Units Subcutaneous QHS Wonda Cerise, MD   25 Units at 02/05/12 2220  . magnesium hydroxide (MILK OF MAGNESIA) suspension 30 mL  30 mL Oral Daily PRN Sanjuana Kava, NP      . nicotine (NICODERM  CQ - dosed in mg/24 hours) patch 14 mg  14 mg Transdermal Q0600 Sanjuana Kava, NP   14 mg at 02/06/12 0631  . ritonavir (NORVIR) tablet 100 mg  100 mg Oral QHS Wonda Cerise, MD   100 mg at 02/05/12 2218  . traZODone (DESYREL) tablet 100 mg  100 mg Oral QHS Sanjuana Kava, NP   100 mg at 02/05/12 2217    Observation Level/Precautions:  Detox  Laboratory:       Routine PRN Medications: Yes  Consultations:    Discharge Concerns:    Other:      Lloyd Huger T. Katherleen Folkes PAC For Dr. Lupe Carney  4/1/20131:37 PM

## 2012-02-06 NOTE — BHH Suicide Risk Assessment (Signed)
Suicide Risk Assessment  Admission Assessment     Demographic factors:  Assessment Details Time of Assessment: Admission Information Obtained From: Patient Current Mental Status:    Loss Factors:  Loss Factors: Decline in physical health;Financial problems / change in socioeconomic status Historical Factors:  Historical Factors: Family history of mental illness or substance abuse Risk Reduction Factors:  Risk Reduction Factors: Sense of responsibility to family;Living with another person, especially a relative;Positive social support  CLINICAL FACTORS:   Depression:   Comorbid alcohol abuse/dependence Hopelessness Severe Alcohol/Substance Abuse/Dependencies More than one psychiatric diagnosis  COGNITIVE FEATURES THAT CONTRIBUTE TO RISK:  Polarized thinking    SUICIDE RISK:   Minimal: No identifiable suicidal ideation.  Patients presenting with no risk factors but with morbid ruminations; may be classified as minimal risk based on the severity of the depressive symptoms  PLAN OF CARE: Pt admits to He has been using 400-500$ of cocaine per day, and 80oz of beer or a liter of wine each day.  He had gone to the ED for a racing pulse.  This is the first time that this happened.  He has binges every 3 weeks.  He lived with his mother until about 9 years ago.  She took care of him.  He has been drinking alcohol since age 32 yo.  He has a wife and 5 children ages 11 - 55  They visit him every scheduled holiday and on weekends.  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  He worked 3 yrs and has never gone back; keeps his license active.  He has worked 15 years in [employer] and loves his work.   He says he never gets over his feeling of depression.  Using cocaine and alcohol does not improve depression.  He says he has voluntarily admitted himself to start recovery.  He has been in  Rehab before.   He says he has a sense of inadequacies and most likely dependence on  his mother Ivin Booty he had his own family] enabled an increased sense of inadequacy.  He was faced with many responsibilities with the suddenness of his mother's death.  Plan of care needs to include detox protocol, monitor for any SI, group and individual therapy; evaluation of self-esteem, introduce antidepressant therapy and referral to outpatient  psychiatrist and therapy.    Moncerrath Berhe 02/06/2012, 3:46 PM

## 2012-02-06 NOTE — Progress Notes (Signed)
Patient ID: Kyle Young, male   DOB: 11-Apr-1971, 41 y.o.   MRN: [id number] He has been up and about interacting with peers. Has attended groups Denies S/I. Depression  Continues.

## 2012-02-06 NOTE — Progress Notes (Signed)
Inpatient Diabetes Program Recommendations  AACE/ADA: New Consensus Statement on Inpatient Glycemic Control (2009)  Target Ranges:  Prepandial:   less than 140 mg/dL      Peak postprandial:   less than 180 mg/dL (1-2 hours)      Critically ill patients:  140 - 180 mg/dL   Reason for Visit: Hyperglycemia  Results for Kyle Young, CROSS (MRN [id number]) as of 02/06/2012 16:14  Ref. Range 02/05/2012 07:52 02/05/2012 16:52 02/05/2012 21:28 02/06/2012 06:09 02/06/2012 11:59  Glucose-Capillary Latest Range: 70-99 mg/dL 478 (H) 295 (H) 621 (H) 231 (H) 269 (H)    Inpatient Diabetes Program Recommendations Insulin - Basal: Increase Lantus to 30 units QHS (Home dose) Insulin - Meal Coverage: Add meal coverage insulin - Novolog 4 units tidwc   Note: Check HgbA1C to assess glycemic control prior to hospitalization.

## 2012-02-06 NOTE — Progress Notes (Signed)
Patient requested counselor contact his supervisor at work, [employer], at [phone], and inform her that he was hospitalized.  Writer did so at 4:45 PM today.  Clide Dales 02/06/2012 5:06 PM

## 2012-02-07 DIAGNOSIS — Z87898 Personal history of other specified conditions: Secondary | ICD-10-CM | POA: Diagnosis present

## 2012-02-07 DIAGNOSIS — F1010 Alcohol abuse, uncomplicated: Secondary | ICD-10-CM | POA: Diagnosis present

## 2012-02-07 DIAGNOSIS — F1994 Other psychoactive substance use, unspecified with psychoactive substance-induced mood disorder: Secondary | ICD-10-CM

## 2012-02-07 LAB — GLUCOSE, CAPILLARY
Glucose-Capillary: 240 mg/dL — ABNORMAL HIGH (ref 70–99)
Glucose-Capillary: 263 mg/dL — ABNORMAL HIGH (ref 70–99)

## 2012-02-07 MED ORDER — INSULIN ASPART 100 UNIT/ML ~~LOC~~ SOLN: 20.0000 [IU] | Freq: Once | SUBCUTANEOUS | Status: DC

## 2012-02-07 MED ORDER — CITALOPRAM HYDROBROMIDE 10 MG PO TABS
10.0000 mg | ORAL_TABLET | Freq: Every day | ORAL | Status: DC
  Administered 2012-02-07 – 2012-02-09 (×3): 10 mg via ORAL
  Filled 2012-02-07 (×5): qty 1

## 2012-02-07 NOTE — Progress Notes (Signed)
Pt.'s [id number]  Pt. Asymptomatic.  Shelda Jakes PA notified.   Sliding Scale of 20 units of  Novolog given.  Pt.  CBG to be reassessed in 2 hours.

## 2012-02-07 NOTE — Progress Notes (Signed)
[facility] MD Progress Note  02/07/2012 2:29 PM  Diagnosis:  Alcohol abuse  ADL's:  Intact  Sleep: Fair  Appetite:  Good  Suicidal Ideation:  Denies today Homicidal Ideation:  denies  Subjective: Kyle Young met with the treatment team this morning to discuss his plans for discharge and continued care once he has completed his detox here.  Yannis stated that he was interested in residential rehabilitation but would also like to consider an outpatient program as well since he does work. He does report never having medication for depression, but is open to trying meds.  He reports racing thoughts. Mental Status Examination/Evaluation: Objective:  Appearance: Casual  Eye Contact::  Good  Speech:  Normal Rate  Volume:  Normal  Mood:  Depressed  Affect:  Congruent  Thought Process:  Coherent  Orientation:  Full  Thought Content:  WDL  Suicidal Thoughts:  No  States "not today."  Homicidal Thoughts:  No  Memory:  Immediate;   Fair  Judgement:  Intact  Insight:  Fair  Psychomotor Activity:  Normal  Concentration:  Fair  Recall:  Fair  Akathisia:  No  Handed:    AIMS (if indicated):     Assets:  Communication Skills Desire for Improvement Social Support Talents/Skills Vocational/Educational  Sleep:  Number of Hours: 6.25    Vital Signs:Blood pressure 116/77, pulse 74, temperature 98.4 F (36.9 C), temperature source Oral, resp. rate 20, height 5' 8.5" (1.74 m), weight 95.709 kg (211 lb). Objective: Pt. Is oriented x 3, reports no SI/HI, no AH/VH.  Reports "racing thoughts" but they are not random thoughts, but are related to each other, such as chores for the next day.  No pressured speech, history of hypomania, no reports of increased goal directed activities. No periods of time of decreased need for sleep, or increased interest in sex.  Current Medications: Current Facility-Administered Medications  Medication Dose Route Frequency Provider Last Rate Last Dose  . acetaminophen  (TYLENOL) tablet 650 mg  650 mg Oral Q6H PRN Sanjuana Kava, NP   650 mg at 02/06/12 1535  . alum & mag hydroxide-simeth (MAALOX/MYLANTA) 200-200-20 MG/5ML suspension 30 mL  30 mL Oral Q4H PRN Sanjuana Kava, NP      . atazanavir (REYATAZ) capsule 300 mg  300 mg Oral QHS Wonda Cerise, MD   300 mg at 02/06/12 2224  . chlordiazePOXIDE (LIBRIUM) capsule 25 mg  25 mg Oral Q6H PRN Verne Spurr, PA-C      . chlordiazePOXIDE (LIBRIUM) capsule 25 mg  25 mg Oral QID Verne Spurr, PA-C   25 mg at 02/07/12 1205   Followed by  . chlordiazePOXIDE (LIBRIUM) capsule 25 mg  25 mg Oral TID Verne Spurr, PA-C       Followed by  . chlordiazePOXIDE (LIBRIUM) capsule 25 mg  25 mg Oral BH-qamhs Verne Spurr, PA-C       Followed by  . chlordiazePOXIDE (LIBRIUM) capsule 25 mg  25 mg Oral Daily Verne Spurr, PA-C      . emtricitabine-tenofovir (TRUVADA) 200-300 MG per tablet 1 tablet  1 tablet Oral QHS Wonda Cerise, MD   1 tablet at 02/06/12 2224  . hydrOXYzine (ATARAX/VISTARIL) tablet 25 mg  25 mg Oral QHS PRN Sanjuana Kava, NP      . hydrOXYzine (ATARAX/VISTARIL) tablet 25 mg  25 mg Oral Q6H PRN Verne Spurr, PA-C      . insulin aspart (novoLOG) injection 0-20 Units  0-20 Units Subcutaneous TID WC Wonda Cerise, MD  11 Units at 02/07/12 1206  . insulin aspart (novoLOG) injection 0-5 Units  0-5 Units Subcutaneous QHS Wonda Cerise, MD   3 Units at 02/06/12 2227  . insulin glargine (LANTUS) injection 25 Units  25 Units Subcutaneous QHS Wonda Cerise, MD   25 Units at 02/06/12 2226  . loperamide (IMODIUM) capsule 2-4 mg  2-4 mg Oral PRN Verne Spurr, PA-C      . magnesium hydroxide (MILK OF MAGNESIA) suspension 30 mL  30 mL Oral Daily PRN Sanjuana Kava, NP      . mulitivitamin with minerals tablet 1 tablet  1 tablet Oral Daily Verne Spurr, PA-C   1 tablet at 02/07/12 1027  . nicotine (NICODERM CQ - dosed in mg/24 hours) patch 14 mg  14 mg Transdermal Q0600 Sanjuana Kava, NP   14 mg at 02/07/12 2536  . ondansetron (ZOFRAN-ODT)  disintegrating tablet 4 mg  4 mg Oral Q6H PRN Verne Spurr, PA-C      . ritonavir (NORVIR) tablet 100 mg  100 mg Oral QHS Wonda Cerise, MD   100 mg at 02/06/12 2224  . thiamine (B-1) injection 100 mg  100 mg Intramuscular Once [employer], PA-C      . thiamine (VITAMIN B-1) tablet 100 mg  100 mg Oral Daily Verne Spurr, PA-C   100 mg at 02/07/12 6440  . traZODone (DESYREL) tablet 100 mg  100 mg Oral QHS Sanjuana Kava, NP   100 mg at 02/06/12 2224    Lab Results:  Results for orders placed during the hospital encounter of 02/05/12 (from the past 48 hour(s))  GLUCOSE, CAPILLARY     Status: Abnormal   Collection Time   02/05/12  4:52 PM      Component Value Range Comment   Glucose-Capillary 262 (*) 70 - 99 (mg/dL)   GLUCOSE, CAPILLARY     Status: Abnormal   Collection Time   02/05/12  9:28 PM      Component Value Range Comment   Glucose-Capillary 339 (*) 70 - 99 (mg/dL)    Comment 1 Notify RN      Comment 2 Documented in Chart     GLUCOSE, CAPILLARY     Status: Abnormal   Collection Time   02/06/12  6:09 AM      Component Value Range Comment   Glucose-Capillary 231 (*) 70 - 99 (mg/dL)   GLUCOSE, CAPILLARY     Status: Abnormal   Collection Time   02/06/12 11:59 AM      Component Value Range Comment   Glucose-Capillary 269 (*) 70 - 99 (mg/dL)    Comment 1 Notify RN     GLUCOSE, CAPILLARY     Status: Abnormal   Collection Time   02/06/12  5:21 PM      Component Value Range Comment   Glucose-Capillary 257 (*) 70 - 99 (mg/dL)   GLUCOSE, CAPILLARY     Status: Abnormal   Collection Time   02/06/12  9:22 PM      Component Value Range Comment   Glucose-Capillary 268 (*) 70 - 99 (mg/dL)   GLUCOSE, CAPILLARY     Status: Abnormal   Collection Time   02/07/12  6:19 AM      Component Value Range Comment   Glucose-Capillary 240 (*) 70 - 99 (mg/dL)   GLUCOSE, CAPILLARY     Status: Abnormal   Collection Time   02/07/12 11:56 AM      Component Value Range Comment   Glucose-Capillary 263 (*)  70 - 99  (mg/dL)    Comment 1 Notify RN       Physical Findings: AIMS:  , ,  ,  ,    CIWA:  CIWA-Ar Total: 0  COWS:     Treatment Plan Summary: Daily contact with patient to assess and evaluate symptoms and progress in treatment Medication management Will start Celexa, generic form, 10mg  po qd. For anxiety and depression related to chronic medical condition. Risks, benefits, and side effects reviewed.  Plan: see above.  Kolter Reaver 02/07/2012, 2:29 PM

## 2012-02-07 NOTE — Progress Notes (Signed)
Pt attended discharge planning group and actively participated.  Pt presents with calm mood and affect.  Pt ranks depression at a 5 and anxiety at a 0 today.  Pt denies SI.  Pt was open with sharing reason for entering the hospital.  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  Pt states that he also has depression.  Pt states that he went to [facility] 2 years ago which helped him stay sober for 3-4 months.  Pt states that he is open to long term treatment.  SW will assess for appropriate referrals.  Pt states that he lives in [place] with his wife and states she is supportive.  No further needs voiced by pt at this time.    Reyes Ivan, LCSWA 02/07/2012  10:01 AM
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
## 2012-02-07 NOTE — Progress Notes (Signed)
Patient ID: Kyle Young, male   DOB: October 09, 1971, 41 y.o.   MRN: [id number] He has been up for groups, medication and meals remainder of time he has been in bed. Self inventory: depression 5, hopelessness 0,. Has not c/o any discomfort.

## 2012-02-07 NOTE — Progress Notes (Signed)
BHH Group Notes:  (Counselor/Nursing/MHT/Case Management/Adjunct)  02/07/2012 5:48 PM   Type of Therapy:  Processing Group at 11:00 am on 02/06/12  Participation Level:  Minimal  Participation Quality:  Attentive  Affect:  Appropriate  Cognitive:  Oriented and alert  Insight:  None shared  Engagement in Group:  Limited  Engagement in Therapy:  Unknown  Modes of Intervention:  Socialization,. Exploration, and support  Summary of Progress/Problems:  Patient attended his first group session since admit and was quiet yet attentive to group process.  BHH Group Notes:  (Counselor/Nursing/MHT/Case Management/Adjunct)  02/07/2012 5:48 PM   Type of Therapy:  Counseling Group at 1:15 pm on 4.1.13  Participation Level:  Did not attend   Ronda Fairly, LCSWA 02/07/2012 5:48 PM

## 2012-02-07 NOTE — Tx Team (Signed)
Interdisciplinary Treatment Plan Update (Adult)  Date:  02/07/2012  Time Reviewed:  10:06 AM   Progress in Treatment: Attending groups: Yes Participating in groups:  Yes Taking medication as prescribed: Yes Tolerating medication:  Yes Family/Significant othe contact made:  Pt refused contact with wife Patient understands diagnosis:  Yes Discussing patient identified problems/goals with staff:  Yes Medical problems stabilized or resolved:  Yes Denies suicidal/homicidal ideation: Yes Issues/concerns per patient self-inventory:  None identified Other: N/A  New problem(s) identified: None Identified  Reason for Continuation of Hospitalization: Anxiety Depression Medication stabilization Suicidal ideation  Interventions implemented related to continuation of hospitalization: mood stabilization, medication monitoring and adjustment, group therapy and psycho education, safety checks q 15 mins  Additional comments: N/A  Estimated length of stay: 3-5 days  Discharge Plan: Pt is open to long term treatment - SW will assess for appropriate referrals.  New goal(s): N/A  Review of initial/current patient goals per problem list:    1.  Goal(s): Address substance use  Met:  No  Target date: by discharge  As evidenced by: completing detox protocol and refer to appropriate treatment  2.  Goal (s): Reduce depressive and anxiety symptoms  Met:  No  Target date: by discharge  As evidenced by: Reducing depression from a 10 to a 3 as reported by pt.  Pt ranks at a 5 today.   3.  Goal(s): Eliminate SI  Met: No  Target date: by discharge  As evidenced by: Pt denying SI   Attendees: Patient:  Kyle Young 02/07/2012 10:07 AM   Family:     Physician:   02/07/2012 10:06 AM   Nursing: Roswell Miners, RN 02/07/2012 10:06 AM   Case Manager:  Reyes Ivan, LCSWA 02/07/2012  10:06 AM   Counselor:  Ronda Fairly, LCSWA 02/07/2012  10:06 AM   Other:  Richelle Ito, LCSW 02/07/2012 10:06 AM     Other:  Verne Spurr, PA 02/07/2012 10:07 AM   Other:  Osborn Coho, LCSWA 02/07/2012 10:07 AM   Other:      Scribe for Treatment Team:   Reyes Ivan 02/07/2012 10:06 AM

## 2012-02-07 NOTE — Progress Notes (Signed)
Pt.'s CBG-374.  Pt. Asymptomatic.  Pt. Denies any issues or concerns at present.  Pt. Encouraged to make better choices  when eating foods that  Will  increase his blood sugar.

## 2012-02-07 NOTE — Progress Notes (Signed)
B[facility] Group Notes:  (Counselor/Nursing/MHT/Case Management/Adjunct)  02/07/2012 5:48 PM   Type of Therapy:  Processing Group at 11:00 am  Participation Level:  Minimal  Participation Quality:  Drowsy  Affect:  Appropriate when awake  Cognitive:  Oriented  Insight:  None shared  Engagement in Group:  None  Engagement in Therapy:  None  Modes of Intervention:  Exploration socialization and support  Summary of Progress/Problems:  Group discussion centered on feelings about diagnosis; patient was attentive only to questions directed specifically to him and he dozed off for majority of group.  BHH Group Notes:  (Counselor/Nursing/MHT/Case Management/Adjunct)  02/07/2012 5:48 PM   Type of Therapy:  Counseling Group at 1:15 pm  Participation Level:  Minimal  Participation Quality:  Took self quiz, dozed off later  Affect:  Appropriate but exhausted  Cognitive:  Oriented  Insight:  None shared  Engagement in Group:  None  Engagement in Therapy:  None  Modes of Intervention:  Exploration, activity and socialization  Summary of Progress/Problems:  Patient continues to have difficulty remaining awake during group time. Berna Spare did participate in addiction quiz and asked to share results with Clinical research associate.    Ronda Fairly, [place] 02/07/2012 5:48 PM

## 2012-02-07 NOTE — Progress Notes (Addendum)
Recreation Therapy Group Note  Date: 02/07/2012        Time: 1000       Group Topic/Focus: Patient invited to participate in animal assisted therapy. Pets as a coping skill and responsibility were discussed.   Participation Level: Minimal  Participation Quality: Appropriate and Attentive  Affect: Appropriate  Cognitive: Appropriate and Oriented   Additional Comments: Patient came late to group, reporting that no one has been telling him when groups were occurring.

## 2012-02-07 NOTE — Progress Notes (Signed)
Pt observed in the dayroom watching TV.  States his day was ok.  He reports minimal withdrawal symptoms at this time.  Pt states he has been diabetic for about 15 yrs.  He states his CBGs run high, because he "likes to cheat".  He says he knows what he should be eating.  Pt denies SI/HI.  He is unsure of his discharge plans at this time.  Safety maintained with q15 minute checks.

## 2012-02-08 LAB — GLUCOSE, CAPILLARY: Glucose-Capillary: 324 mg/dL — ABNORMAL HIGH (ref 70–99)

## 2012-02-08 MED ORDER — INSULIN ASPART 100 UNIT/ML ~~LOC~~ SOLN: 4.0000 [IU] | Freq: Three times a day (TID) | SUBCUTANEOUS | Status: DC

## 2012-02-08 MED ORDER — INSULIN GLARGINE 100 UNIT/ML ~~LOC~~ SOLN
30.0000 [IU] | Freq: Every day | SUBCUTANEOUS | Status: DC
  Administered 2012-02-08: 30 [IU] via SUBCUTANEOUS
  Filled 2012-02-08: qty 3

## 2012-02-08 MED ORDER — INSULIN ASPART 100 UNIT/ML ~~LOC~~ SOLN
4.0000 [IU] | Freq: Three times a day (TID) | SUBCUTANEOUS | Status: DC
  Administered 2012-02-08 – 2012-02-09 (×3): 4 [IU] via SUBCUTANEOUS

## 2012-02-08 MED ORDER — INSULIN GLARGINE 100 UNIT/ML ~~LOC~~ SOLN: 30.0000 [IU] | Freq: Every day | SUBCUTANEOUS | Status: DC

## 2012-02-08 MED ORDER — INSULIN ASPART 100 UNIT/ML ~~LOC~~ SOLN: 0.0000 [IU] | Freq: Three times a day (TID) | SUBCUTANEOUS | Status: DC

## 2012-02-08 MED ORDER — INSULIN ASPART 100 UNIT/ML ~~LOC~~ SOLN
0.0000 [IU] | Freq: Three times a day (TID) | SUBCUTANEOUS | Status: DC
  Administered 2012-02-08: 15 [IU] via SUBCUTANEOUS
  Administered 2012-02-09 (×2): 11 [IU] via SUBCUTANEOUS

## 2012-02-08 NOTE — Progress Notes (Signed)
Pt resting in bed with eyes closed.  No distress observed.  Safety maintained with q15 minute checks. 

## 2012-02-08 NOTE — Progress Notes (Signed)
Pt has been in bed most of the evening.  Several attempts to get pt up for med pass were made before he actually got up to come to the med window.  He did get up earlier after group time to get a snack, but went right back to bed.  He did not attend group.  He reports he is very sleepy from his medication.  He has no other complaints.  Interaction with this Clinical research associate is appropriate.  He denies withdrawal symptoms.  He denies SI/HI.  He had adjustments to his Insulin dosing for tonight.  He is still running in the 300's.  He is not compliant with a diabetic diet, although he has been instructed on how to make good food choices.  Safety maintained with q15 minute checks.

## 2012-02-08 NOTE — Progress Notes (Signed)
B[facility] Group Notes:  (Counselor/Nursing/MHT/Case Management/Adjunct)  02/08/2012 4:17 PM  Type of Therapy:  1:15PM Group Therapy  Participation Level:  None  Participation Quality:  Asleep  Affect:  Unable to Assess  Cognitive:  Unable to Assess  Insight:  None  Engagement in Group:  None  Engagement in Therapy:  None  Modes of Intervention:  Support and Exploration  Summary of Progress/Problems: Patient asleep during group therapy session.   Kyle Young 02/08/2012, 4:17 PM

## 2012-02-08 NOTE — Progress Notes (Signed)
Pt attended discharge planning group and actively participated.  Pt presents with calm mood and affect.  Pt ranks depression at a 4-5 and denies anxiety and SI.  Pt reports feeling better today.  Pt is still open to any long term treatment options.  SW explained pt couldn't go to [facility] after all, due to pt having insurance.  SW states [facility] [place].  Pt was not interested in [facility].  SW provided pt with further information on [id number] [facility] [place].  If pt is interested, SW will make this referral.  No further needs voiced by pt at this time.    Kyle Young, LCSWA 02/08/2012  11:02 AM

## 2012-02-08 NOTE — Progress Notes (Signed)
Patient ID: Kyle Young, male   DOB: 07/17/1971, 41 y.o.   MRN: [id number] He has been up for groups , medication and meals. In bed remainder of the time.  Has Not had any c/o discomfort.

## 2012-02-08 NOTE — Progress Notes (Addendum)
BHH Group Notes:  (Counselor/Nursing/MHT/Case Management/Adjunct)  02/08/2012 10:31 AM   Type of Therapy:  Processing Group at 11:00 am  Participation Level:  None  Participation Quality:  Asleep; unable to participate due to drowsiness caused by medication  Clide Dales 02/09/2012 9:51 AM

## 2012-02-09 DIAGNOSIS — F1410 Cocaine abuse, uncomplicated: Secondary | ICD-10-CM | POA: Diagnosis present

## 2012-02-09 LAB — GLUCOSE, CAPILLARY
Glucose-Capillary: 293 mg/dL — ABNORMAL HIGH (ref 70–99)
Glucose-Capillary: 258 mg/dL — ABNORMAL HIGH (ref 70–99)

## 2012-02-09 MED ORDER — INSULIN ASPART 100 UNIT/ML ~~LOC~~ SOLN
10.0000 [IU] | SUBCUTANEOUS | Status: DC
  Filled 2012-02-09: qty 3

## 2012-02-09 MED ORDER — TRAZODONE HCL 100 MG PO TABS: 100.0000 mg | ORAL_TABLET | Freq: Every day | ORAL | Status: DC

## 2012-02-09 MED ORDER — CITALOPRAM HYDROBROMIDE 10 MG PO TABS: 10.0000 mg | ORAL_TABLET | Freq: Every day | ORAL | Status: DC

## 2012-02-09 NOTE — Progress Notes (Signed)
[facility] Adult Inpatient Family/Significant Other Suicide Prevention Education  Suicide Prevention Education:  Patient Discharged to Other Healthcare Facility:  Suicide Prevention Education Not Provided: {PT. DISCHARGED TO OTHER HEALTHCARE FACILITY:SUICIDE PREVENTION EDUCATION NOT PROVIDED (CHL):  The patient is discharging to another healthcare facility for continuation of treatment.  The patient's medical information, including suicide ideations and risk factors, are a part of the medical information shared with the receiving healthcare facility.  Clide Dales 02/09/2012, 9:53 AM

## 2012-02-09 NOTE — Progress Notes (Signed)
B[facility] Group Notes:  (Counselor/Nursing/MHT/Case Management/Adjunct)  02/09/2012 12:53 PM  Type of Therapy:  Group Therapy  Participation Level:  Minimal  Participation Quality:  Attentive  Affect:  Appropriate  Cognitive:  Appropriate  Insight:  None shared  Engagement in Group:  Limited  Engagement in Therapy:  Limited  Modes of Intervention:  Socialization  Summary of Progress/Problems:  Tremain spent majority of group time with either physician or PA, but did come into group room and was attentive.  When he came in he sat in open chair which another patient had left and returned to claim as his; Gottlieb was graciously moved.   Clide Dales 02/09/2012, 12:53 PM

## 2012-02-09 NOTE — Progress Notes (Signed)
Patient ID: Kyle Young, male   DOB: 1971/07/24, 41 y.o.   MRN: [id number] He has been up and about and to groups, interacting with peers and staff. Smiling and talking more today. Observed on the phone talking.

## 2012-02-09 NOTE — Progress Notes (Signed)
[facility] Adult Inpatient Family/Significant Other Suicide Prevention Education  Suicide Prevention Education:  Patient Discharged to Other Healthcare Facility:  Suicide Prevention Education Not Provided: {PT. DISCHARGED TO OTHER HEALTHCARE FACILITY:SUICIDE PREVENTION EDUCATION NOT PROVIDED (CHL):  The patient is discharging to another healthcare facility for continuation of treatment.  The patient's medical information, including suicide ideations and risk factors, are a part of the medical information shared with the receiving healthcare facility.  Clide Dales 02/09/2012, 8:12 PM

## 2012-02-09 NOTE — BHH Suicide Risk Assessment (Signed)
Suicide Risk Assessment  Discharge Assessment     Demographic factors:  Male  Current Mental Status Per Nursing Assessment::   On Admission:    At Discharge:  AO x 3.  No SI/HI.   Current Mental Status Per Physician:  Diagnosis:   Axis I:  Alcohol Dependence.  Cocaine Abuse.  Substance Induced Mood Disorder.  The patient was seen today and reports the following:   ADL's: Intact.  Sleep: The patient reports to sleeping well last night without difficulty.  Appetite: The patient reports a good appetite today.   Mild>(1-10) >Severe  Hopelessness (1-10): 0  Depression (1-10): 3  Anxiety (1-10): 3   Suicidal Ideation: The patient denies any suicidal ideations today.  Plan: No  Intent: No  Means: No  Homicidal Ideation: The patient adamantly denies any homicidal ideations today.  Plan: No  Intent: No.  Means: No   General Appearance/Behavior: The patient was friendly and cooperative today.  Eye Contact: Good.  Speech: Appropriate in rate and volume with no pressuring of speech noted today.  Motor Behavior: wnl.  Level of Consciousness: Alert and Oriented x 3.  Mental Status: Alert and Oriented x 3.  Mood: Mildly depression today.  Affect: Mildly constricted today.  Anxiety Level: Mild anxiety reported.  Thought Process: wnl.  Thought Content: The patient denies any auditory or visual hallucinations. He denies any delusional thinking.  Perception: wnl.  Judgment: Fair to Good.  Insight: Fair to Good.  Cognition: Oriented to person, place and time.   Historical Factors:  Substance abuse related issues.   Risk Reduction Factors:  Good primary support.   Continued Clinical Symptoms:   Depression: Anhedonia  Comorbid alcohol abuse/dependence  Alcohol/Substance Abuse/Dependencies  More than one psychiatric diagnosis  Previous Psychiatric Diagnoses and Treatments   Discharge Diagnoses:   AXIS I:   Alcohol Dependence.   Cocaine Abuse.   Substance Induced  Mood Disorder. AXIS II:   Deferred. AXIS III:   1. History of Pneumonia.   2. Diabetes Mellitus.   3. HIV Positive.   4. MRSA. AXIS IV:   Serious Medical Illnesses.  Ongoing Substance Abuse Issues. AXIS V:   GAF at time of admission approximately 35.  GAF at time of discharge approximately 60.  Cognitive Features That Contribute To Risk:  None Noted.   Lab Results: No results found for this or any previous visit (from the past 48 hour(s)).   The patient was seen today and states that he is doing much better regarding his psychiatric symptoms.  The patient states that he is sleeping well and reports a good appetite.  He reports mild depression and anxiety and denies any substance withdrawal symptoms.  The patient states that he is happy about being accepted to [facility] and is looking forward to his transfer today.  Treatment Plan Summary:  1. Daily contact with patient to assess and evaluate symptoms and progress in treatment  2. Medication management  3. The patient will deny suicidal ideations or homicidal ideations for 48 hours prior to discharge and have a depression and anxiety rating of 3 or less. The patient will also deny any auditory or visual hallucinations or delusional thinking.  4. The patient will deny any symptoms of substance withdrawal at time of discharge.   Plan:  1. Will continue the patient on his current medications.  2. Will continue to monitor.  3. Laboratory studies reviewed.  4. Discharge today to enter [facility] for further treatment of his substance abuse issues.  Suicide Risk:  Minimal: No identifiable suicidal ideation. Patients presenting with no risk factors but with morbid ruminations; may be classified as minimal risk based on the severity of the depressive symptoms   Plan Of Care/Follow-up recommendations:  Activity: As tolerated.  Diet: Regular Diet.  Other: The patient is being discharged today to enter [facility] for further treatment of his substance abuse  issues.  Kypton Eltringham 02/09/2012, 1:35 PM

## 2012-02-09 NOTE — Progress Notes (Signed)
Inpatient Diabetes Program Recommendations  AACE/ADA: New Consensus Statement on Inpatient Glycemic Control (2009)  Target Ranges:  Prepandial:   less than 140 mg/dL      Peak postprandial:   less than 180 mg/dL (1-2 hours)      Critically ill patients:  140 - 180 mg/dL   Reason for Visit: Hyperglycemia  Inpatient Diabetes Program Recommendations Insulin - Basal: * Insulin - Meal Coverage: Increase meal coverage insulin to 10 units tidwc.  At home, pt on 10 - 25 units tidwc for meal coverage insulin HgbA1C: Need HgbA1C to assess glycemic control prior to hospitalization  Note: Will follow.

## 2012-02-09 NOTE — Progress Notes (Signed)
Inpatient Diabetes Program Recommendations  AACE/ADA: New Consensus Statement on Inpatient Glycemic Control (2009)  Target Ranges:  Prepandial:   less than 140 mg/dL      Peak postprandial:   less than 180 mg/dL (1-2 hours)      Critically ill patients:  140 - 180 mg/dL   Reason for Visit: Hyperglycemia  Results for Kyle Young, Kyle Young (MRN [id number]) as of 02/09/2012 09:49  Ref. Range 02/08/2012 10:38 02/08/2012 11:56 02/08/2012 16:46 02/08/2012 21:23 02/09/2012 06:05  Glucose-Capillary Latest Range: 70-99 mg/dL  409 (H) 811 (H) 914 (H) 293 (H)    Inpatient Diabetes Program Recommendations Insulin - Basal: * Insulin - Meal Coverage: Increase Novolog to 6 units tidwc.  Note: Will follow.

## 2012-02-09 NOTE — Progress Notes (Signed)
[facility] Case Management Discharge Plan:  Will you be returning to the same living situation after discharge: Yes,  return home after treatment At discharge, do you have transportation home?:Yes,  [facility] will transport pt home Do you have the ability to pay for your medications:Yes,  14 day supply of meds provided   Release of information consent forms completed and in the chart;  Patient's signature needed at discharge.  Patient to Follow up at:  Follow-up Information    Follow up with [facility] on 02/09/2012. ([facility] will pick up today at 2:00 pm promptly!!)    Contact information:   [address] [place], [place] [address] [phone]         Patient denies SI/HI:   Yes,  denies SI/HI    Safety Planning and Suicide Prevention discussed:  Yes,  discussed with pt  Barrier to discharge identified:No.  Summary and Recommendations: Pt attended discharge planning group and actively participated.  Pt presents with calm mood and affect.  Pt ranks depression at a 3 and anxiety at a 0 today.  Pt denies SI today.  No recommendations from SW.  No further needs voiced by pt.  Pt stable to discharge.     Carmina Miller 02/09/2012, 11:09 AM

## 2012-02-09 NOTE — Tx Team (Signed)
Interdisciplinary Treatment Plan Update (Adult)  Date:  02/09/2012  Time Reviewed:  9:58 AM   Progress in Treatment: Attending groups: Yes Participating in groups:  Yes Taking medication as prescribed: Yes Tolerating medication:  Yes Family/Significant othe contact made:  No, pt refused contact Patient understands diagnosis:  Yes Discussing patient identified problems/goals with staff:  Yes Medical problems stabilized or resolved:  Yes Denies suicidal/homicidal ideation: Yes Issues/concerns per patient self-inventory:  None identified Other: N/A  New problem(s) identified: None Identified  Reason for Continuation of Hospitalization: Stable to d/c  Interventions implemented related to continuation of hospitalization: Stable to d/c  Additional comments: N/A  Estimated length of stay: D/C today  Discharge Plan: Pt will follow up at [facility] for further SA treatment.    New goal(s): N/A  Review of initial/current patient goals per problem list:    1.  Goal(s): Address substance use  Met:  Yes  Target date: by discharge  As evidenced by: completed detox protocol and referred to appropriate treatment  2.  Goal (s): Reduce depressive and anxiety symptoms  Met:  Yes  Target date: by discharge  As evidenced by: Reducing depression from a 10 to a 3 as reported by pt.  Pt ranks depression at a 3 and denies having anxiety.   3.  Goal(s): Eliminate SI  Met:  Yes  Target date: by discharge  As evidenced by: Pt denies SI.    Attendees: Patient:  Kyle Young 02/09/2012 10:00 AM   Family:     Physician:   02/09/2012 9:58 AM   Nursing: Izola Price, RN 02/09/2012 9:58 AM   Case Manager:  Reyes Ivan, LCSWA 02/09/2012  9:58 AM   Counselor:  Ronda Fairly, LCSWA 02/09/2012  9:58 AM   Other:  Tanya Nones, SW intern 02/09/2012 9:58 AM   Other:  Verne Spurr, PA 02/09/2012 10:00 AM   Other:     Other:      Scribe for Treatment Team:   Reyes Ivan 02/09/2012 9:58 AM

## 2012-02-09 NOTE — Discharge Summary (Signed)
Physician Discharge Summary Note  Patient:  Kyle Young is an 41 y.o., male MRN:  [id number] DOB:  [birth year] Patient phone:  [phone] (home)  Patient address:   [address] [place] [address],   Date of Admission:  02/05/2012 Date of Discharge: 02/09/2012  Reason for Admission:Cocaine abuse   Discharge Diagnoses: Principal Problem:  *Alcohol abuse Active Problems:  Substance induced mood disorder  Cocaine abuse   Axis Diagnosis:  Axis l:  Alcohol abuse, Cocaine abuse, SIMDO Axis ll: deferred Axislll: Diabetes, HIV+, MRSA+, Pneumonia AxislV: problems related to social and primary support group Axis V: GAF: 60  Level of Care:  In patient  Hospital Course:   [place]  was admitted for detox from alcohol, cocaine and crisis management.  He was treated with the standard Librium  protocol.  Medical problems were identified and treated.  Home medication was restarted as appropriate.     Improvement was monitored by CIWA/COWS scores and patient's daily report of withdrawal symptom reduction. Emotional and mental status was monitored by daily self inventory reports completed by the patient and clinical staff.      The patient was evaluated by the treatment team for stability and plans for continued recovery upon discharge. He was offered further treatment options upon discharge including Residential, IOP, and Outpatient treatment.  The patient's motivation was an integral factor for scheduling further treatment.  Employment, transportation, bed availability, health status, family support, and any pending legal issues were also considered. He asked for further treatment and a bed at [facility] was requested.    Upon completion of detox the patient was both mentally and medically stable for discharge.    Consults: None  Significant Diagnostic Studies:  Routine CBG's and Admission labs  Discharge Vitals:   Blood pressure 108/64, pulse 77, temperature 98.2 F (36.8 C), temperature  source Oral, resp. rate 20, height 5' 8.5" (1.74 m), weight 95.709 kg (211 lb).  Mental Status Exam: See Mental Status Examination and Suicide Risk Assessment completed by Attending Physician prior to discharge. Is patient on multiple antipsychotic therapies at discharge:  No   Has Patient had three or more failed trials of antipsychotic monotherapy by history:  No Recommended Plan for Multiple Antipsychotic Therapies: not applicable  Discharge Orders    Future Orders Please Complete By Expires   Diet - low sodium heart healthy      Increase activity slowly      Discharge instructions      Comments:   Take all medications as prescribed.     Medication List  As of 02/09/2012  1:41 PM   TAKE these medications      Indication    atazanavir 300 MG capsule   Commonly known as: REYATAZ   Take 300 mg by mouth daily with breakfast.    For HIV Infection.    citalopram 10 MG tablet   Commonly known as: CELEXA   Take 1 tablet (10 mg total) by mouth daily.    Indication: Depression      emtricitabine-tenofovir 200-300 MG per tablet   Commonly known as: TRUVADA   Take 1 tablet by mouth daily.   For HIV Infection.    LANTUS SOLOSTAR 100 UNIT/ML injection   Generic drug: insulin glargine   Inject 30 Units into the skin at bedtime.   For elevated blood sugar.    NOVOLOG FLEXPEN 100 UNIT/ML injection   Generic drug: insulin aspart   Inject 10-25 Units into the skin 3 (three) times daily before  meals. According to sliding scale   For elevated blood sugar.    ritonavir 100 MG capsule   Commonly known as: NORVIR   Take 100 mg by mouth daily.   For HIV infection.    traZODone 100 MG tablet   Commonly known as: DESYREL   Take 1 tablet (100 mg total) by mouth at bedtime.    Indication: Trouble Sleeping             [place] T. Mashburn PAC For Dr. Harvie Heck Pansey Pinheiro 02/09/2012, 1:41 PM

## 2012-02-10 NOTE — Progress Notes (Signed)
Patient Discharge Instructions:  Psychiatric Admission Assessment Note Provided,  02/10/2012 After Visit Summary (AVS) Provided,  02/10/2012 Face Sheet Provided, 02/10/2012 Faxed/Sent to the Next Level Care provider:  02/10/2012 Provided Suicide Risk Assessment - Discharge Assessment 02/10/2012  Faxed to [facility] @ [phone]  Wandra Scot, 02/10/2012, 4:37 PM

## 2012-02-19 DIAGNOSIS — B20 Human immunodeficiency virus [HIV] disease: Secondary | ICD-10-CM | POA: Diagnosis present

## 2012-02-19 DIAGNOSIS — E119 Type 2 diabetes mellitus without complications: Secondary | ICD-10-CM | POA: Diagnosis present

## 2012-02-23 ENCOUNTER — Emergency Department (HOSPITAL_BASED_OUTPATIENT_CLINIC_OR_DEPARTMENT_OTHER)
Admission: EM | Admit: 2012-02-23 | Discharge: 2012-02-23 | Disposition: A | Discharge status: Home / Self Care | Payer: 59 | Attending: Emergency Medicine | Admitting: Emergency Medicine

## 2012-02-23 ENCOUNTER — Encounter (HOSPITAL_BASED_OUTPATIENT_CLINIC_OR_DEPARTMENT_OTHER): Payer: Self-pay | Admitting: *Deleted

## 2012-02-23 DIAGNOSIS — F329 Major depressive disorder, single episode, unspecified: Secondary | ICD-10-CM | POA: Insufficient documentation

## 2012-02-23 DIAGNOSIS — F17200 Nicotine dependence, unspecified, uncomplicated: Secondary | ICD-10-CM | POA: Insufficient documentation

## 2012-02-23 DIAGNOSIS — M79609 Pain in unspecified limb: Secondary | ICD-10-CM | POA: Insufficient documentation

## 2012-02-23 DIAGNOSIS — F3289 Other specified depressive episodes: Secondary | ICD-10-CM | POA: Insufficient documentation

## 2012-02-23 DIAGNOSIS — Z21 Asymptomatic human immunodeficiency virus [HIV] infection status: Secondary | ICD-10-CM | POA: Insufficient documentation

## 2012-02-23 DIAGNOSIS — M7989 Other specified soft tissue disorders: Secondary | ICD-10-CM | POA: Insufficient documentation

## 2012-02-23 DIAGNOSIS — E119 Type 2 diabetes mellitus without complications: Secondary | ICD-10-CM | POA: Insufficient documentation

## 2012-02-23 DIAGNOSIS — R600 Localized edema: Secondary | ICD-10-CM

## 2012-02-23 LAB — CBC
HCT: 38.4 % — ABNORMAL LOW (ref 39.0–52.0)
Platelets: 194 10*3/uL (ref 150–400)
RDW: 13.2 % (ref 11.5–15.5)
WBC: 5.3 10*3/uL (ref 4.0–10.5)

## 2012-02-23 LAB — COMPREHENSIVE METABOLIC PANEL
ALT: 31 U/L (ref 0–53)
AST: 24 U/L (ref 0–37)
CO2: 28 mEq/L (ref 19–32)
Chloride: 101 mEq/L (ref 96–112)
GFR calc non Af Amer: >90 mL/min (ref >90)
Potassium: 3.7 mEq/L (ref 3.5–5.1)
Sodium: 139 mEq/L (ref 135–145)
Total Bilirubin: 1.2 mg/dL (ref 0.3–1.2)

## 2012-02-23 LAB — DIFFERENTIAL
Basophils Absolute: 0.1 10*3/uL (ref 0.0–0.1)
Lymphocytes Relative: 61 % — ABNORMAL HIGH (ref 12–46)
Monocytes Absolute: 0.5 10*3/uL (ref 0.1–1.0)
Neutro Abs: 1.5 10*3/uL — ABNORMAL LOW (ref 1.7–7.7)
Neutrophils Relative %: 27 % — ABNORMAL LOW (ref 43–77)

## 2012-02-23 MED ORDER — KETOROLAC TROMETHAMINE 60 MG/2ML IM SOLN
60.0000 mg | Freq: Once | INTRAMUSCULAR | Status: AC
  Administered 2012-02-23: 60 mg via INTRAMUSCULAR
  Filled 2012-02-23: qty 2

## 2012-02-23 MED ORDER — FUROSEMIDE 20 MG PO TABS: ORAL_TABLET | ORAL | Status: DC

## 2012-02-23 MED ORDER — FUROSEMIDE 40 MG PO TABS
40.0000 mg | ORAL_TABLET | Freq: Once | ORAL | Status: AC
  Administered 2012-02-23: 40 mg via ORAL
  Filled 2012-02-23: qty 1

## 2012-02-23 NOTE — ED Provider Notes (Signed)
History     CSN: [id number]  Arrival date & time 02/23/12  1842   First MD Initiated Contact with Patient 02/23/12 1954      Chief Complaint  Patient presents with  . Leg Swelling    (Consider location/radiation/quality/duration/timing/severity/associated sxs/prior treatment) Patient is a 41 y.o. male presenting with extremity pain. The history is provided by the patient. No language interpreter was used.  Extremity Pain This is a new problem. The current episode started in the past 7 days. The problem occurs constantly. The problem has been gradually worsening. Associated symptoms include joint swelling and myalgias. Pertinent negatives include no vomiting or weakness. The symptoms are aggravated by bending and walking. He has tried NSAIDs for the symptoms. The treatment provided no relief.  Pt complains of swelling to bilat lower legs.  Pt released from [facility] today.  Pt reports house MD gave him ibuprofen and hctz with no relief.  Past Medical History  Diagnosis Date  . Pneumonia   . Diabetes mellitus   . HIV positive   . MRSA (methicillin resistant Staphylococcus aureus)   . Mental disorder   . Depression     Past Surgical History  Procedure Date  . Mrsa surgery     History reviewed. No pertinent family history.  History  Substance Use Topics  . Smoking status: Current Everyday Smoker    Types: Cigarettes  . Smokeless tobacco: Not on file  . Alcohol Use: Yes      Review of Systems  Gastrointestinal: Negative for vomiting.  Musculoskeletal: Positive for myalgias and joint swelling.  Neurological: Negative for weakness.  All other systems reviewed and are negative.    Allergies  Bactrim  Home Medications   Current Outpatient Rx  Name Route Sig Dispense Refill  . ATAZANAVIR SULFATE 300 MG PO CAPS Oral Take 300 mg by mouth daily with breakfast.    . CITALOPRAM HYDROBROMIDE 10 MG PO TABS Oral Take 1 tablet (10 mg total) by mouth daily. 30 tablet 0  .  EMTRICITABINE-TENOFOVIR 200-300 MG PO TABS Oral Take 1 tablet by mouth daily.    Marland Kitchen NEURONTIN PO Oral Take by mouth.    Marland Kitchen HYDROCHLOROTHIAZIDE 12.5 MG PO CAPS Oral Take 12.5 mg by mouth daily.    . IBUPROFEN 200 MG PO TABS Oral Take 800 mg by mouth every 6 (six) hours as needed. Patient used this medication for swelling and pain.    . INSULIN ASPART 100 UNIT/ML [place] SOLN Subcutaneous Inject 10-25 Units into the skin 3 (three) times daily before meals. According to sliding scale    . INSULIN GLARGINE 100 UNIT/ML  SOLN Subcutaneous Inject 30 Units into the skin at bedtime.     Marland Kitchen RITONAVIR 100 MG PO CAPS Oral Take 100 mg by mouth daily.      . TRAZODONE HCL 100 MG PO TABS Oral Take 1 tablet (100 mg total) by mouth at bedtime. 30 tablet 0    BP 138/75  Pulse 87  Temp(Src) 97.9 F (36.6 C) (Oral)  Resp 16  SpO2 97%  Physical Exam  Nursing note and vitals reviewed. Constitutional: He appears well-developed and well-nourished.  HENT:  Head: Normocephalic.  Eyes: Pupils are equal, round, and reactive to light.  Neck: Normal range of motion.  Cardiovascular: Normal rate.   Pulmonary/Chest: Effort normal.  Musculoskeletal: He exhibits edema and tenderness.  Neurological: He is alert.  Skin: Skin is warm.    ED Course  Procedures (including critical care time)  Labs Reviewed  CBC - Abnormal; Notable for the following:    RBC 4.16 (*)    HCT 38.4 (*)    All other components within normal limits  DIFFERENTIAL - Abnormal; Notable for the following:    Neutrophils Relative 27 (*)    Neutro Abs 1.5 (*)    Lymphocytes Relative 61 (*)    All other components within normal limits  COMPREHENSIVE METABOLIC PANEL - Abnormal; Notable for the following:    Glucose, Bld 202 (*)    All other components within normal limits   No results found.   No diagnosis found.    MDM   Results for orders placed during the hospital encounter of 02/23/12  CBC      Component Value Range   WBC 5.3   4.0 - 10.5 (K/uL)   RBC 4.16 (*) 4.22 - 5.81 (MIL/uL)   Hemoglobin 13.4  13.0 - 17.0 (g/dL)   HCT 47.8 (*) 29.5 - 52.0 (%)   MCV 92.3  78.0 - 100.0 (fL)   MCH 32.2  26.0 - 34.0 (pg)   MCHC 34.9  30.0 - 36.0 (g/dL)   RDW 62.1  30.8 - 65.7 (%)   Platelets 194  150 - 400 (K/uL)  DIFFERENTIAL      Component Value Range   Neutrophils Relative 27 (*) 43 - 77 (%)   Neutro Abs 1.5 (*) 1.7 - 7.7 (K/uL)   Lymphocytes Relative 61 (*) 12 - 46 (%)   Lymphs Abs 3.2  0.7 - 4.0 (K/uL)   Monocytes Relative 9  3 - 12 (%)   Monocytes Absolute 0.5  0.1 - 1.0 (K/uL)   Eosinophils Relative 2  0 - 5 (%)   Eosinophils Absolute 0.1  0.0 - 0.7 (K/uL)   Basophils Relative 1  0 - 1 (%)   Basophils Absolute 0.1  0.0 - 0.1 (K/uL)  COMPREHENSIVE METABOLIC PANEL      Component Value Range   Sodium 139  135 - 145 (mEq/L)   Potassium 3.7  3.5 - 5.1 (mEq/L)   Chloride 101  96 - 112 (mEq/L)   CO2 28  19 - 32 (mEq/L)   Glucose, Bld 202 (*) 70 - 99 (mg/dL)   BUN 13  6 - 23 (mg/dL)   Creatinine, Ser 8.46  0.50 - 1.35 (mg/dL)   Calcium 96.2  8.4 - 10.5 (mg/dL)   Total Protein 7.0  6.0 - 8.3 (g/dL)   Albumin 4.0  3.5 - 5.2 (g/dL)   AST 24  0 - 37 (U/L)   ALT 31  0 - 53 (U/L)   Alkaline Phosphatase 93  39 - 117 (U/L)   Total Bilirubin 1.2  0.3 - 1.2 (mg/dL)   GFR calc non Af Amer >90  >90 (mL/min)   GFR calc Af Amer >90  >90 (mL/min)   Dg Chest 2 View  02/04/2012  *RADIOLOGY REPORT*  Clinical Data: Cough.  Rule out infiltrate.  CHF.  Query free air. Left breast chest pain tonight.  CHEST - 2 VIEW  Comparison: 10/23/2011  Findings: Slightly shallow inspiration.  Normal heart size and pulmonary vascularity.  Mild peribronchial thickening suggesting chronic bronchitis.  No focal airspace consolidation.  No blunting of costophrenic angles.  No pneumothorax.  Mild thoracic scoliosis convex towards the right.  Stable appearance since previous study.  IMPRESSION: Chronic bronchitic changes.  No evidence of active pulmonary  disease.  Original Report Authenticated By: Marlon Pel, M.D.   Pt given lasix 40 mg po here.  Pt given rx for lasix 20 mg.          Lonia Skinner [place], [place] 02/23/12 2152

## 2012-02-23 NOTE — ED Notes (Signed)
Pt states that he has had bilat lower extremity swelling x one week states that he has been seeing the "house doctor" where he stays. Pt has been using NSAID neurontin and "fluid pills" [facility] and was sent here pt with tingling burning pain

## 2012-02-23 NOTE — Discharge Instructions (Signed)
Peripheral Edema You have swelling in your legs (peripheral edema). This swelling is due to excess accumulation of salt and water in your body. Edema may be a sign of heart, kidney or liver disease, or a side effect of a medication. It may also be due to problems in the leg veins. Elevating your legs and using special support stockings may be very helpful, if the cause of the swelling is due to poor venous circulation. Avoid long periods of standing, whatever the cause. Treatment of edema depends on identifying the cause. Chips, pretzels, pickles and other salty foods should be avoided. Restricting salt in your diet is almost always needed. Water pills (diuretics) are often used to remove the excess salt and water from your body via urine. These medicines prevent the kidney from reabsorbing sodium. This increases urine flow. Diuretic treatment may also result in lowering of potassium levels in your body. Potassium supplements may be needed if you have to use diuretics daily. Daily weights can help you keep track of your progress in clearing your edema. You should call your caregiver for follow up care as recommended. SEEK IMMEDIATE MEDICAL CARE IF:   You have increased swelling, pain, redness, or heat in your legs.   You develop shortness of breath, especially when lying down.   You develop chest or abdominal pain, weakness, or fainting.   You have a fever.  Document Released: 12/01/2004 Document Revised: 10/13/2011 Document Reviewed: 11/11/2009 ExitCare Patient Information 2012 [employer]. 

## 2012-02-23 NOTE — ED Notes (Signed)
10 days ago left foot started to hurt, saw pcp was given lasix secondary swelling, 5 days later no improvement, started on neurotin, no improvement, c/o numbness/tingling, pt is diabetic and blood sugars per patient have been high

## 2012-02-25 NOTE — ED Provider Notes (Signed)
Medical screening examination/treatment/procedure(s) were performed by non-physician practitioner and as supervising physician I was immediately available for consultation/collaboration.  Fleur Audino, MD 02/25/12 0742 

## 2012-03-28 ENCOUNTER — Encounter (HOSPITAL_COMMUNITY): Payer: Self-pay

## 2012-03-28 ENCOUNTER — Emergency Department (HOSPITAL_COMMUNITY): Payer: Managed Care, Other (non HMO)

## 2012-03-28 ENCOUNTER — Emergency Department (HOSPITAL_COMMUNITY)
Admission: EM | Admit: 2012-03-28 | Discharge: 2012-03-28 | Disposition: A | Discharge status: Home / Self Care | Payer: Managed Care, Other (non HMO) | Attending: Emergency Medicine | Admitting: Emergency Medicine

## 2012-03-28 DIAGNOSIS — F1010 Alcohol abuse, uncomplicated: Secondary | ICD-10-CM | POA: Insufficient documentation

## 2012-03-28 DIAGNOSIS — R079 Chest pain, unspecified: Secondary | ICD-10-CM | POA: Insufficient documentation

## 2012-03-28 DIAGNOSIS — E119 Type 2 diabetes mellitus without complications: Secondary | ICD-10-CM | POA: Insufficient documentation

## 2012-03-28 DIAGNOSIS — Z21 Asymptomatic human immunodeficiency virus [HIV] infection status: Secondary | ICD-10-CM | POA: Insufficient documentation

## 2012-03-28 DIAGNOSIS — Z794 Long term (current) use of insulin: Secondary | ICD-10-CM | POA: Insufficient documentation

## 2012-03-28 DIAGNOSIS — F1410 Cocaine abuse, uncomplicated: Secondary | ICD-10-CM | POA: Insufficient documentation

## 2012-03-28 LAB — ETHANOL: Alcohol, Ethyl (B): <11 mg/dL (ref 0–11)

## 2012-03-28 LAB — COMPREHENSIVE METABOLIC PANEL
Alkaline Phosphatase: 76 U/L (ref 39–117)
BUN: 9 mg/dL (ref 6–23)
CO2: 24 mEq/L (ref 19–32)
Calcium: 9.6 mg/dL (ref 8.4–10.5)
GFR calc Af Amer: >90 mL/min (ref >90)
GFR calc non Af Amer: >90 mL/min (ref >90)
Glucose, Bld: 141 mg/dL — ABNORMAL HIGH (ref 70–99)
Potassium: 3.2 mEq/L — ABNORMAL LOW (ref 3.5–5.1)
Total Protein: 7.2 g/dL (ref 6.0–8.3)

## 2012-03-28 LAB — RAPID URINE DRUG SCREEN, HOSP PERFORMED
Barbiturates: NOT DETECTED
Benzodiazepines: NOT DETECTED

## 2012-03-28 LAB — CBC
HCT: 40.6 % (ref 39.0–52.0)
Hemoglobin: 14.5 g/dL (ref 13.0–17.0)
MCH: 32.1 pg (ref 26.0–34.0)
MCHC: 35.7 g/dL (ref 30.0–36.0)
RBC: 4.52 MIL/uL (ref 4.22–5.81)

## 2012-03-28 MED ORDER — POTASSIUM CHLORIDE CRYS ER 20 MEQ PO TBCR
40.0000 meq | EXTENDED_RELEASE_TABLET | Freq: Once | ORAL | Status: AC
  Administered 2012-03-28: 40 meq via ORAL
  Filled 2012-03-28: qty 1

## 2012-03-28 MED ORDER — IBUPROFEN 800 MG PO TABS
800.0000 mg | ORAL_TABLET | Freq: Once | ORAL | Status: AC
  Administered 2012-03-28: 800 mg via ORAL
  Filled 2012-03-28: qty 1

## 2012-03-28 NOTE — ED Notes (Signed)
Report received-airway intact-no s/s's of distress-will continue to monitor 

## 2012-03-28 NOTE — Discharge Instructions (Signed)
Substance Abuse  Your exam indicates that you have a problem with substance abuse. Substance abuse is the misuse of alcohol or drugs that causes problems in family life, friendships, and work relationships. Substance abuse is the most important cause of premature illness, disability, and death in our society. It is also the greatest threat to a person's mental and spiritual well being.  Substance abuse can start out in an innocent way, such as social drinking or taking a little extra medication prescribed by your doctor. No one starts out with the intention of becoming an alcoholic or an addict. Substance abuse victims cannot control their use of alcohol or drugs. They may become intoxicated daily or go on weekend binges. Often there is a strong desire to quit, but attempts to stop using often fail. Encounters with law enforcement or conflicts with family members, friends, and work associates are signs of a potential problem.  Recovery is always possible, although the craving for some drugs makes it difficult to quit without assistance. Many treatment programs are available to help people stop abusing alcohol or drugs. The first step in treatment is to admit you have a problem. This is a major hurdle because denial is a powerful force with substance abuse.  Alcoholics Anonymous, Narcotics Anonymous, Cocaine Anonymous, and other recovery groups and programs can be very useful in helping people to quit. If you do not feel okay about your drug or alcohol use and if it is causing you trouble, we want to encourage you to talk about it with your doctor or with someone from a recovery group who can help you. You could also call the National Institute on Drug Abuse at 1-800-662-HELP. It is up to you to take the first step.  AL-ANON and ALA-TEEN are support groups for friends and family members of an alcohol or drug dependent person. The people who love and care for the alcoholic or addicted person often need help, too. For  information about these organizations, check your phone directory or call a local alcohol or drug treatment center.  Document Released: 12/01/2004 Document Revised: 10/13/2011 Document Reviewed: 10/25/2008  ExitCare Patient Information 2012 [employer].

## 2012-03-28 NOTE — ED Notes (Signed)
Pt admits to doing cocaine last night around 7pm, now having a tight chest pain

## 2012-03-28 NOTE — ED Notes (Signed)
TC with Olegario Messier @ [facility] - she will check on treatment beds and get back with me. Left message with their insurance coordinator to see if they would take private insurance.  TC with Greg @ [facility]. No male treatment beds open. Stated they have a wait list of approx 10 people and have projected time for opening in July 2013.

## 2012-03-28 NOTE — ED Provider Notes (Addendum)
41 y.o. Male with history of iddm and cocaine abuse here with chest pain- pending 0930 enzymes.  HIV +, dm stable.  Patient to have act evaluation.   Hilario Quarry, MD 03/28/12 [phone]  ACT evaluated and not candidate for admission.  Patient taking po well.  Cardiac markers negative x 3.  Plan discharge with follow up info for substance abuse  Results for orders placed during the hospital encounter of 03/28/12  TROPONIN I      Component Value Range   Troponin I <0.30  <0.30 (ng/mL)  TROPONIN I      Component Value Range   Troponin I <0.30  <0.30 (ng/mL)  TROPONIN I      Component Value Range   Troponin I <0.30  <0.30 (ng/mL)  CBC      Component Value Range   WBC 6.1  4.0 - 10.5 (K/uL)   RBC 4.52  4.22 - 5.81 (MIL/uL)   Hemoglobin 14.5  13.0 - 17.0 (g/dL)   HCT 96.0  45.4 - 09.8 (%)   MCV 89.8  78.0 - 100.0 (fL)   MCH 32.1  26.0 - 34.0 (pg)   MCHC 35.7  30.0 - 36.0 (g/dL)   RDW 11.9  14.7 - 82.9 (%)   Platelets 234  150 - 400 (K/uL)  COMPREHENSIVE METABOLIC PANEL      Component Value Range   Sodium 135  135 - 145 (mEq/L)   Potassium 3.2 (*) 3.5 - 5.1 (mEq/L)   Chloride 99  96 - 112 (mEq/L)   CO2 24  19 - 32 (mEq/L)   Glucose, Bld 141 (*) 70 - 99 (mg/dL)   BUN 9  6 - 23 (mg/dL)   Creatinine, Ser 5.62  0.50 - 1.35 (mg/dL)   Calcium 9.6  8.4 - 13.0 (mg/dL)   Total Protein 7.2  6.0 - 8.3 (g/dL)   Albumin 4.2  3.5 - 5.2 (g/dL)   AST 34  0 - 37 (U/L)   ALT 38  0 - 53 (U/L)   Alkaline Phosphatase 76  39 - 117 (U/L)   Total Bilirubin 0.6  0.3 - 1.2 (mg/dL)   GFR calc non Af Amer >90  >90 (mL/min)   GFR calc Af Amer >90  >90 (mL/min)  ETHANOL      Component Value Range   Alcohol, Ethyl (B) <11  0 - 11 (mg/dL)  URINE RAPID DRUG SCREEN (HOSP PERFORMED)      Component Value Range   Opiates NONE DETECTED  NONE DETECTED    Cocaine POSITIVE (*) NONE DETECTED    Benzodiazepines NONE DETECTED  NONE DETECTED    Amphetamines NONE DETECTED  NONE DETECTED    Tetrahydrocannabinol NONE  DETECTED  NONE DETECTED    Barbiturates NONE DETECTED  NONE DETECTED      Hilario Quarry, MD 03/28/12 1218

## 2012-03-28 NOTE — ED Provider Notes (Signed)
History     CSN: [id number]  Arrival date & time 03/28/12  0202   First MD Initiated Contact with Patient 03/28/12 [phone]      Chief Complaint  Patient presents with  . Chest Pain    (Consider location/radiation/quality/duration/timing/severity/associated sxs/prior treatment) HPI 41 year old male 41 year old male presents to emergency department complaining of left-sided chest pain under his nipple. Patient reports symptoms started today around 5 or 6:00 after using cocaine for the last 4 days. Patient reports similar symptoms in the past when using cocaine. He denies ever having a heart attack due to cocaine. No family history of coronary disease. Patient has history of diabetes and HIV positive. Patient reports he takes his medications faithfully. He had been clean for about 3 months prior to going on his current cocaine binge. Patient denies any shortness of breath, nausea, sweatiness. Chest pain is a dull ache now. No radiation of the pain. Patient is requesting detox   Past Medical History  Diagnosis Date  . Pneumonia   . Diabetes mellitus   . HIV positive   . MRSA (methicillin resistant Staphylococcus aureus)   . Mental disorder   . Depression     Past Surgical History  Procedure Date  . Mrsa surgery     History reviewed. No pertinent family history.  History  Substance Use Topics  . Smoking status: Current Everyday Smoker    Types: Cigarettes  . Smokeless tobacco: Not on file  . Alcohol Use: Yes      Review of Systems  All other systems reviewed and are negative.    Allergies  Bactrim  Home Medications   Current Outpatient Rx  Name Route Sig Dispense Refill  . ATAZANAVIR SULFATE 300 MG PO CAPS Oral Take 300 mg by mouth daily with breakfast.    . CITALOPRAM HYDROBROMIDE 10 MG PO TABS Oral Take 1 tablet (10 mg total) by mouth daily. 30 tablet 0  . EMTRICITABINE-TENOFOVIR 200-300 MG PO TABS Oral Take 1 tablet by mouth daily.    Marland Kitchen GABAPENTIN 300 MG PO  CAPS Oral Take 300 mg by mouth 3 (three) times daily.    . INSULIN ASPART 100 UNIT/ML [place] SOLN Subcutaneous Inject 10-25 Units into the skin 3 (three) times daily before meals. According to sliding scale    . INSULIN GLARGINE 100 UNIT/ML [place] SOLN Subcutaneous Inject 30 Units into the skin at bedtime.     Marland Kitchen RITONAVIR 100 MG PO CAPS Oral Take 100 mg by mouth daily.      . IBUPROFEN 200 MG PO TABS Oral Take 800 mg by mouth every 6 (six) hours as needed. Patient used this medication for swelling and pain.      BP 134/75  Pulse 89  Temp(Src) 98.8 F (37.1 C) (Oral)  Resp 22  SpO2 95%  Physical Exam  Nursing note and vitals reviewed. Constitutional: He is oriented to person, place, and time. He appears well-developed and well-nourished.  HENT:  Head: Normocephalic and atraumatic.  Nose: Nose normal.  Mouth/Throat: Oropharynx is clear and moist.  Eyes: Conjunctivae and EOM are normal. Pupils are equal, round, and reactive to light.  Neck: Normal range of motion. Neck supple. No JVD present. No tracheal deviation present. No thyromegaly present.  Cardiovascular: Normal rate, regular rhythm, normal heart sounds and intact distal pulses.  Exam reveals no gallop and no friction rub.   No murmur heard. Pulmonary/Chest: Effort normal and breath sounds normal. No stridor. No respiratory distress. He has no wheezes. He has  no rales. He exhibits tenderness (has point tenderness under his left nipple which he reports reproduces his pain).  Abdominal: Soft. Bowel sounds are normal. He exhibits no distension and no mass. There is no tenderness. There is no rebound and no guarding.  Musculoskeletal: Normal range of motion. He exhibits no edema and no tenderness.  Lymphadenopathy:    He has no cervical adenopathy.  Neurological: He is oriented to person, place, and time. He exhibits normal muscle tone. Coordination normal.  Skin: Skin is dry. No rash noted. No erythema. No pallor.  Psychiatric: He has a  normal mood and affect. His behavior is normal. Judgment and thought content normal.    ED Course  Procedures (including critical care time)  Labs Reviewed  COMPREHENSIVE METABOLIC PANEL - Abnormal; Notable for the following:    Potassium 3.2 (*)    Glucose, Bld 141 (*)    All other components within normal limits  URINE RAPID DRUG SCREEN (HOSP PERFORMED) - Abnormal; Notable for the following:    Cocaine POSITIVE (*)    All other components within normal limits  TROPONIN I  TROPONIN I  CBC  ETHANOL  TROPONIN I   Dg Chest 2 View  03/28/2012  *RADIOLOGY REPORT*  Clinical Data: Chest pain  CHEST - 2 VIEW  Comparison: 02/04/2012  Findings: Cardiomediastinal contours are within normal limits. Mild peribronchial cuffing again noted.  No focal consolidation. No pleural effusion or pneumothorax.  Mild rightward curvature of the thoracic spine.  No acute osseous finding.  The  IMPRESSION: Mild bronchitic changes without definite evidence for acute cardiopulmonary process.  Original Report Authenticated By: Waneta Martins, M.D.    Date: 03/28/2012  Rate: 91  Rhythm: normal sinus rhythm  QRS Axis: left  Intervals: normal  ST/T Wave abnormalities: normal  Conduction Disutrbances:none  Narrative Interpretation: LVH, RVH  Old EKG Reviewed: unchanged    1. Chest pain   2. Alcohol abuse       MDM  41 yo male with chest pain after using cocaine. Pain appears to be his chest wall, but given history of diabetes and HIV we'll get serial cardiac markers, EKG chest x-ray. When medically cleared we'll discuss with ACT team for admission for detox  8:11 AM Care passed to Dr Rosalia Hammers awaiting third troponin.  D/w Aurther Loft from ACT who will have patient seen for help with his cocaine addiction      Olivia Mackie, MD 03/28/12 [phone]

## 2012-03-28 NOTE — BH Assessment (Signed)
Assessment Note   Kyle Young is an 41 y.o. male. Presents requesting detox from Cocaine & ETOH. Pt denies SI, HI or psychosis. Stated he has had one binge on ETOH & cocaine since his discharge from [facility] and treatment from [facility] in April 2013. Pt reported last drink was approx 10 pm on 03/27/12 (ETOH was negative) and last cocaine use approx 10 pm 03/27/12. Pt stated he had been taking his medications since d/c from [facility] and found that stay helpful. Pt requesting stay at [facility]. Explained to pt that due to all things discussed, pt needed treatment vs. Detox. Explained all options with pt, who was agreeable but not happy.  Axis I: Alcohol Abuse and Cocaine Abuse Axis II: Deferred Axis III:  Past Medical History  Diagnosis Date  . Pneumonia   . Diabetes mellitus   . HIV positive   . MRSA (methicillin resistant Staphylococcus aureus)   . Mental disorder   . Depression    Axis IV: housing problems, problems related to social environment and problems with primary support group Axis V: 41-50 serious symptoms  Past Medical History:  Past Medical History  Diagnosis Date  . Pneumonia   . Diabetes mellitus   . HIV positive   . MRSA (methicillin resistant Staphylococcus aureus)   . Mental disorder   . Depression     Past Surgical History  Procedure Date  . Mrsa surgery     Family History: History reviewed. No pertinent family history.  Social History:  reports that he has been smoking Cigarettes.  He does not have any smokeless tobacco history on file. He reports that he drinks alcohol. He reports that he uses illicit drugs (Cocaine).  Additional Social History:  Alcohol / Drug Use Pain Medications: N/A Prescriptions: See PTA Listing Over the Counter: N/A History of alcohol / drug use?: Yes Longest period of sobriety (when/how long): 45 days - until this binge Substance #1 Name of Substance 1: ETOH 1 - Age of First Use: 8 1 - Amount (size/oz): 3-4 40oz beer 1 - Frequency: daily when  binging 1 - Duration: 1 time since 02/04/12 1 - Last Use / Amount: 03/27/12 10pm / 40oz  Substance #2 Name of Substance 2: Cocaine 2 - Age of First Use: 22 2 - Amount (size/oz): $150-200 2 - Frequency: several days during a binge 2 - Duration: 1 time since 02/04/12 2 - Last Use / Amount: 03/27/12 approx 10 pmg / $150-200 Allergies:  Allergies  Allergen Reactions  . Bactrim Rash and Other (See Comments)    Fainting     Home Medications:  (Not in a hospital admission)  OB/GYN Status:  No LMP for male patient.  General Assessment Data Location of Assessment: [facility] ED Living Arrangements: Spouse/significant other Can pt return to current living arrangement?: Yes Admission Status: Voluntary Is patient capable of signing voluntary admission?: Yes Transfer from: Acute Hospital Referral Source: Self/Family/Friend  Education Status Is patient currently in school?: No  Risk to self Suicidal Ideation: No (Pt denied) Suicidal Intent: No-Not Currently/Within Last 6 Months Is patient at risk for suicide?: No Suicidal Plan?: No Specify Current Suicidal Plan: N/A Access to Means: No Specify Access to Suicidal Means: N/A What has been your use of drugs/alcohol within the last 12 months?: ETOH 40 oz & $150-200 of cocaine 03/27/12 around 10 PM Previous Attempts/Gestures: No How many times?: 0  Other Self Harm Risks: N/A Triggers for Past Attempts: None known Intentional Self Injurious Behavior: None Family Suicide History: Yes (  Brother attempted) Recent stressful life event(s): Other (Comment) (Relapse ) Persecutory voices/beliefs?: No Depression: Yes Depression Symptoms: Insomnia;Feeling angry/irritable Substance abuse history and/or treatment for substance abuse?: Yes Suicide prevention information given to non-admitted patients: Not applicable  Risk to Others Homicidal Ideation: No Thoughts of Harm to Others: No Current Homicidal Intent: No Current Homicidal Plan: No Access to  Homicidal Means: No Identified Victim: N/A History of harm to others?: No Assessment of Violence: None Noted Does patient have access to weapons?: No Criminal Charges Pending?: No (Pt denied) Does patient have a court date: No  Psychosis Hallucinations: None noted Delusions: None noted  Mental Status Report Appear/Hygiene: Disheveled Eye Contact: Fair Motor Activity: Freedom of movement Speech: Logical/coherent Level of Consciousness: Quiet/awake Mood: Depressed Affect: Appropriate to circumstance Anxiety Level: Minimal Thought Processes: Coherent;Relevant Judgement: Unimpaired Orientation: Person;Time;Place;Situation Obsessive Compulsive Thoughts/Behaviors: None  Cognitive Functioning Concentration: Normal Memory: Recent Intact;Remote Intact IQ: Average Insight: Fair Impulse Control: Poor Appetite: Fair Weight Loss: 0  Weight Gain: 0  Sleep: Decreased Total Hours of Sleep: 2  (2-3 hours per day) Vegetative Symptoms: None  Prior Inpatient Therapy Prior Inpatient Therapy: Yes Prior Therapy Dates: 01/2012 Prior Therapy Facilty/Provider(s): [facility] Reason for Treatment: SA, Depression  Prior Outpatient Therapy Prior Outpatient Therapy: No  ADL Screening (condition at time of admission) Patient's cognitive ability adequate to safely complete daily activities?: Yes Patient able to express need for assistance with ADLs?: Yes Independently performs ADLs?: Yes Weakness of Legs: None Weakness of Arms/Hands: None       Abuse/Neglect Assessment (Assessment to be complete while patient is alone) Physical Abuse: Denies Verbal Abuse: Denies Sexual Abuse: Denies Exploitation of patient/patient's resources: Denies Self-Neglect: Denies Values / Beliefs Cultural Requests During Hospitalization: None Spiritual Requests During Hospitalization: None   Advance Directives (For Healthcare) Advance Directive: Patient does not have advance directive;Patient would not like  information Pre-existing out of facility DNR order (yellow form or pink MOST form): No Nutrition Screen Diet: Regular  Additional Information 1:1 In Past 12 Months?: No CIRT Risk: No Elopement Risk: No Does patient have medical clearance?: Yes     Disposition:  Disposition Disposition of Patient:  (Referred to [facility]; [facility])  On Site Evaluation by:   Reviewed with Physician:     Romeo Apple 03/28/2012 11:50 AM

## 2012-03-28 NOTE — ED Notes (Signed)
Pt states used crack cocaine tonight not c/o left sided chest tightness. Pt states "I don't want to leave here I want to go back to behavioral health." Pt alert x3 respirations easy non-labored. Monitor intact.

## 2013-04-22 IMAGING — CR DG CHEST 2V
2 series · 2 of 2 positions shown · non-contrast
Comparison: Chest x-ray 02/09/2011.

CLINICAL DATA: Cough and congestion.

CHEST - 2 VIEW

[w chest pa]
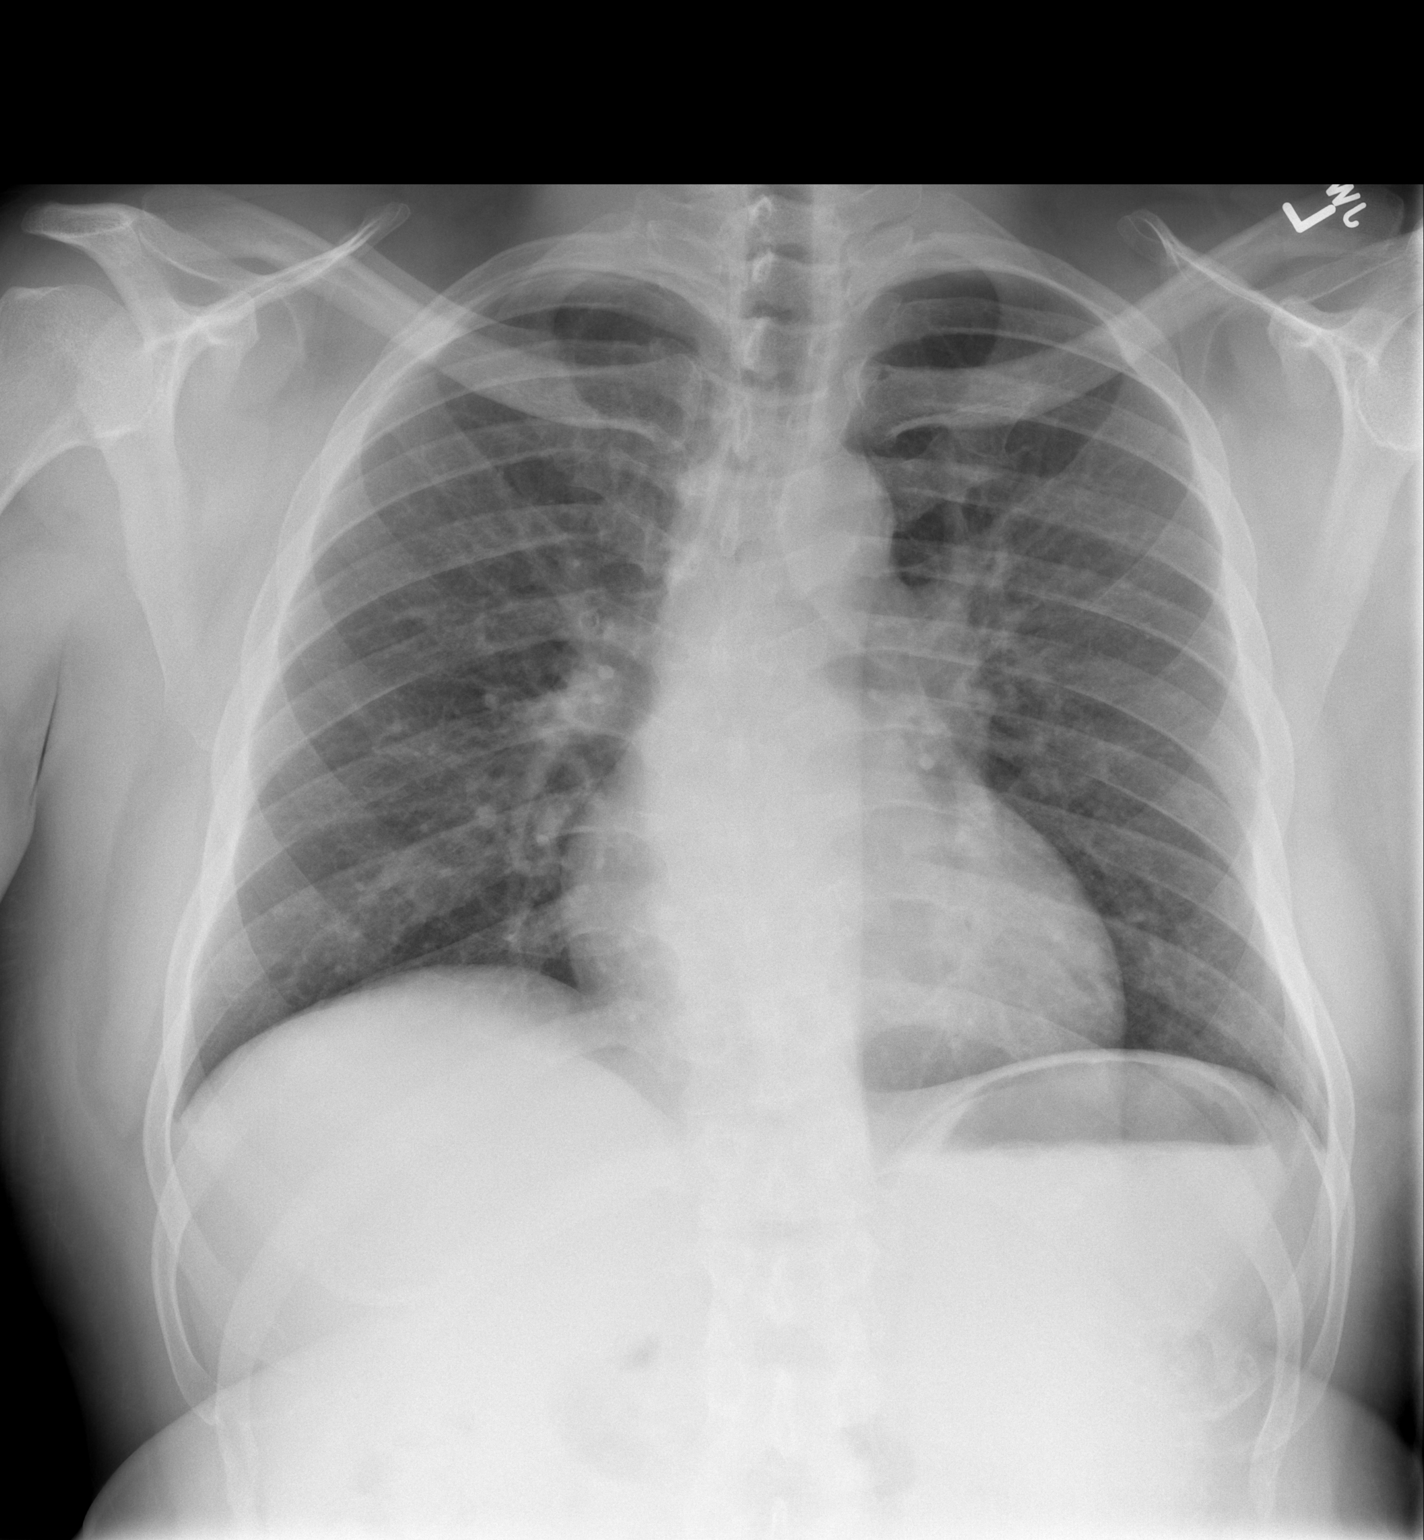

[w chest lat]
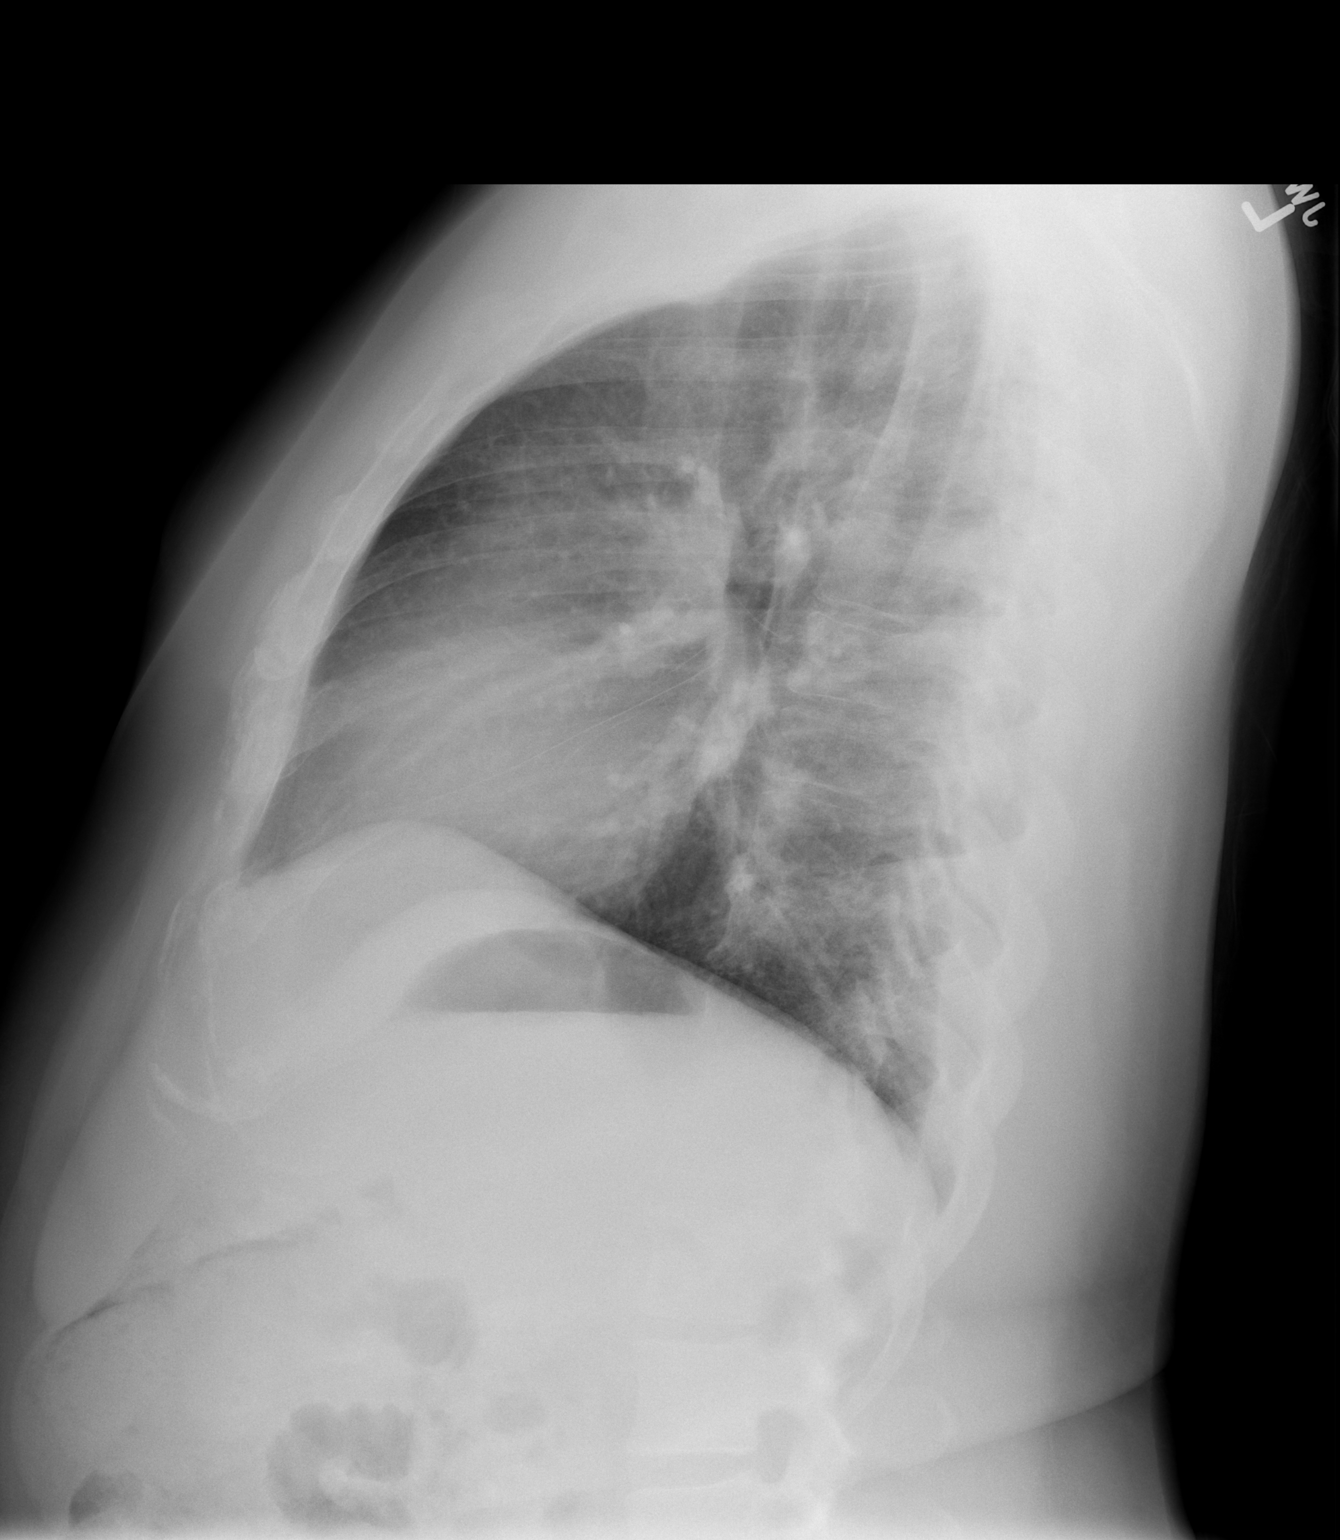

[2 of 2 positions shown; findings below may reference images not displayed]

FINDINGS: The cardiac silhouette, mediastinal and hilar contours
are within normal limits and stable.  The lungs demonstrate
bronchitic changes with peribronchial thickening and slight
increased interstitial markings suggesting bronchitis.  No focal
infiltrates or effusions.  The bony thorax is intact.
IMPRESSION: Findings suggest bronchitis.  No focal infiltrates.

## 2013-08-04 IMAGING — CR DG CHEST 2V
2 series · 2 of 2 positions shown · non-contrast
Comparison: 10/23/2011

CLINICAL DATA: Cough.  Rule out infiltrate.  CHF.  Query free air.
Left breast chest pain tonight.

CHEST - 2 VIEW

[w chest pa]
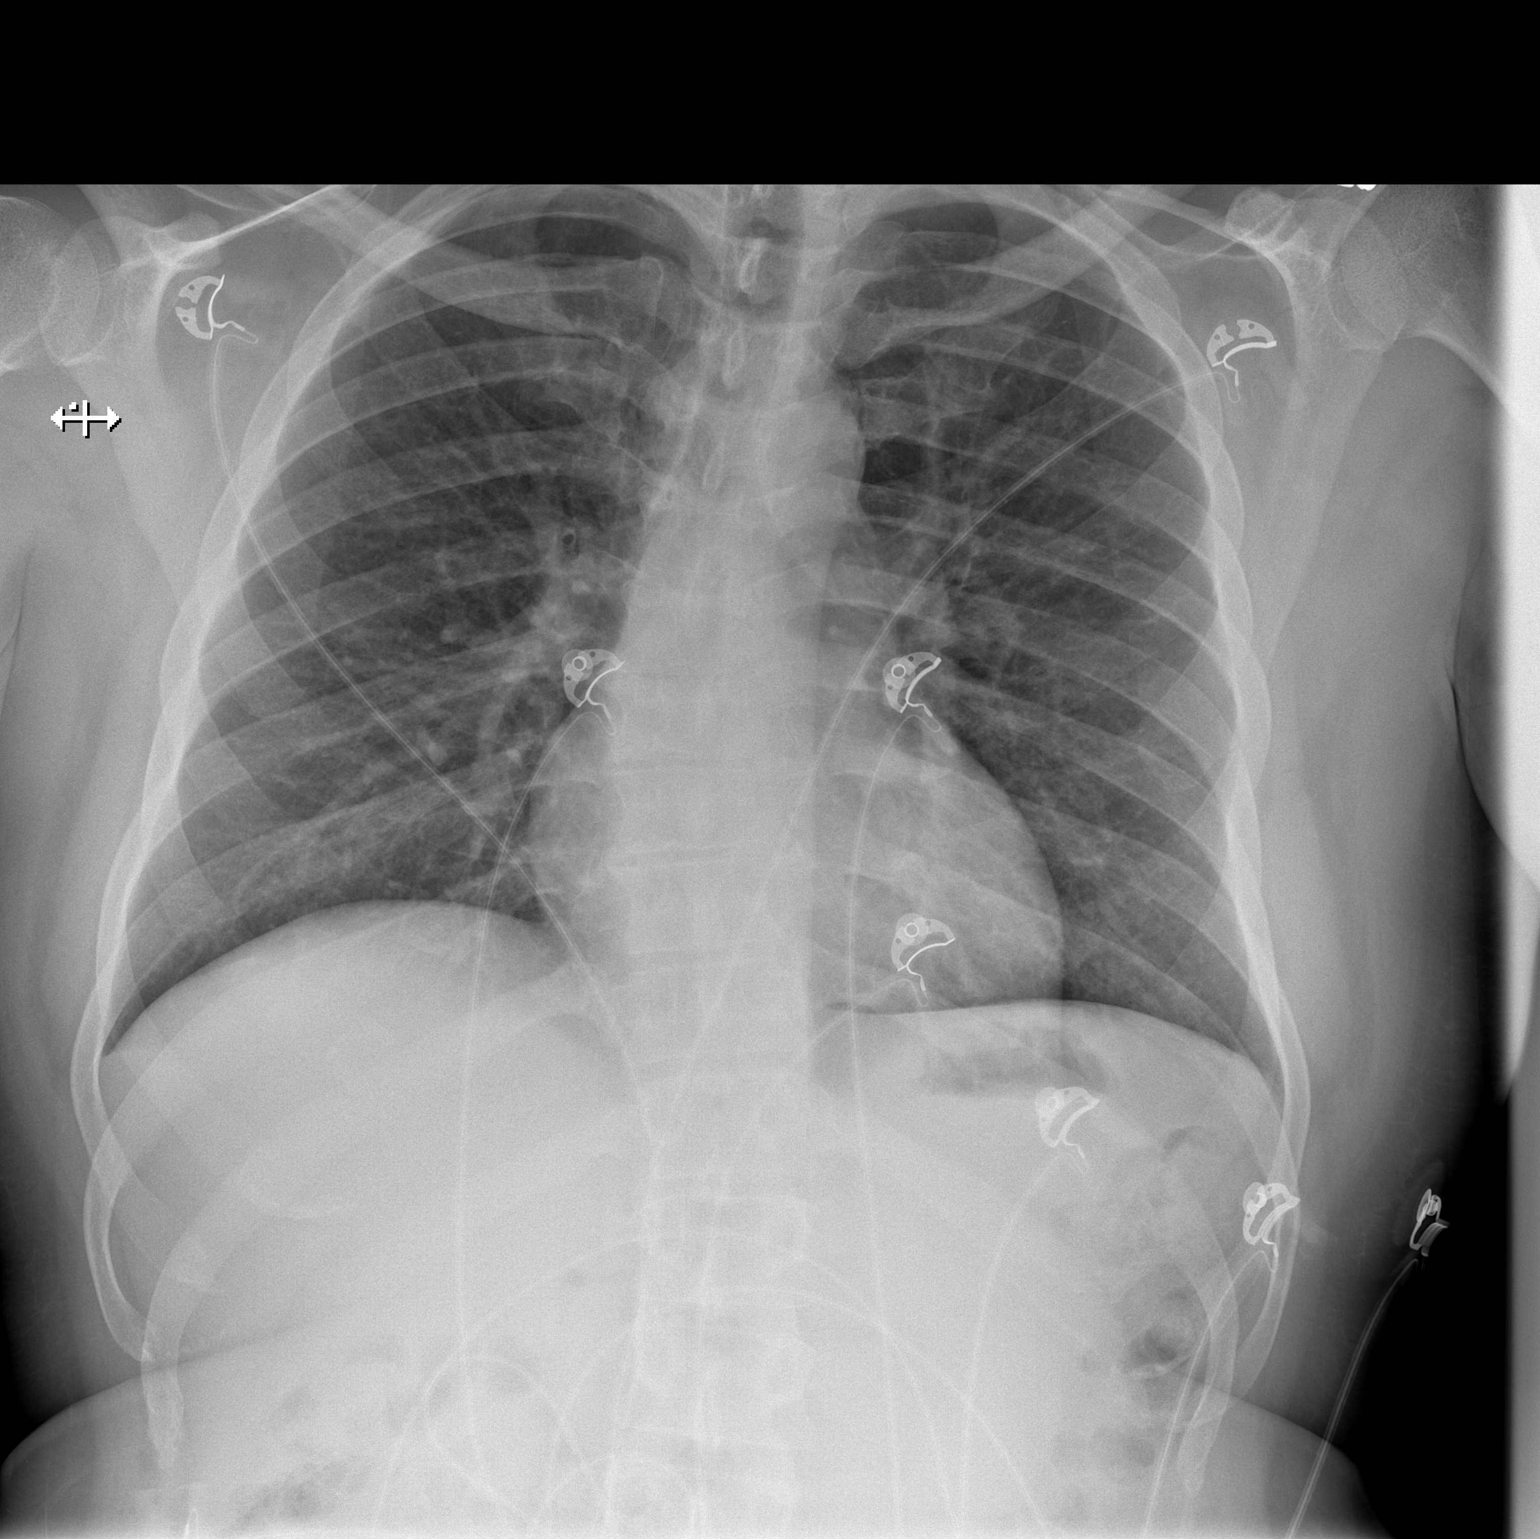

[w chest lat]
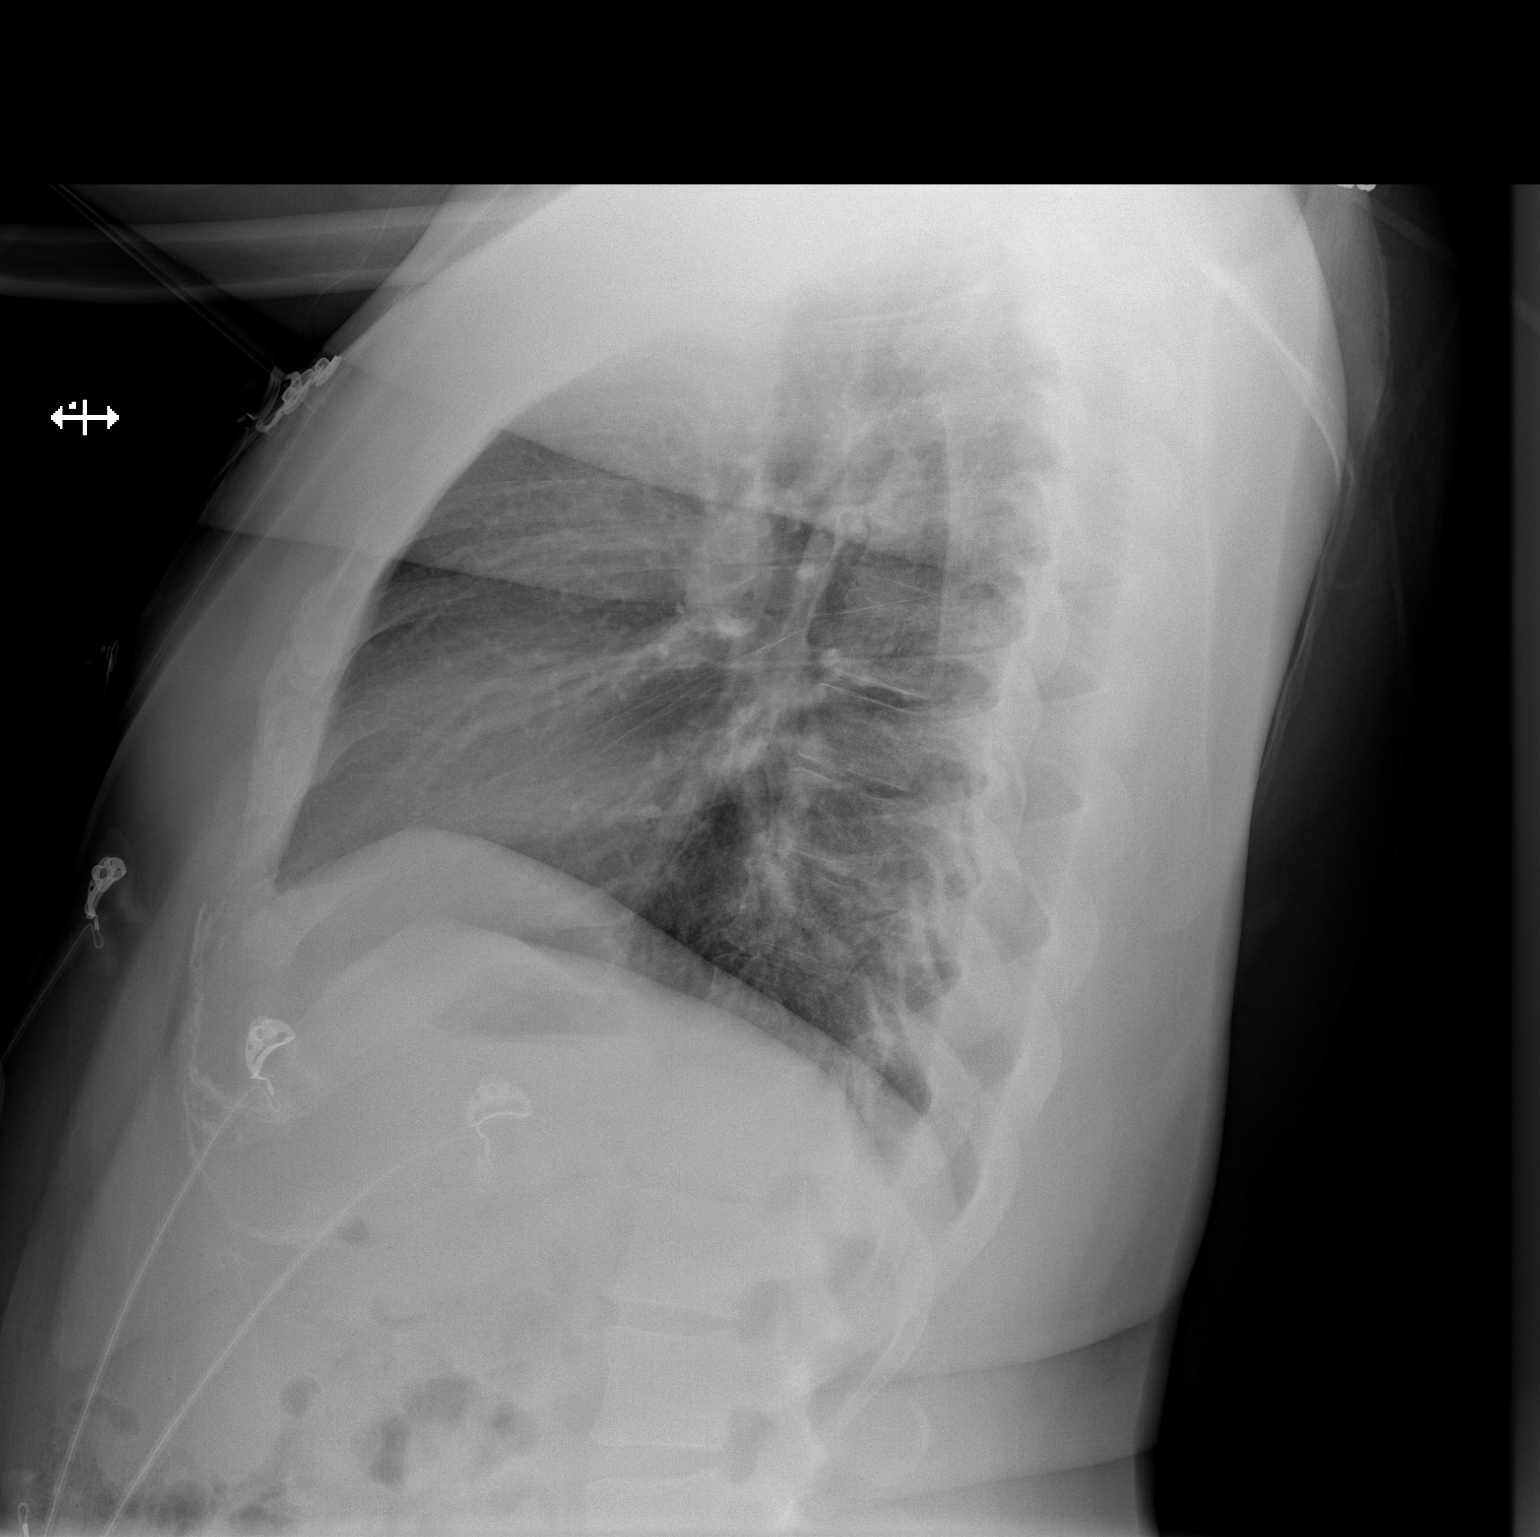

[2 of 2 positions shown; findings below may reference images not displayed]

FINDINGS: Slightly shallow inspiration.  Normal heart size and
pulmonary vascularity.  Mild peribronchial thickening suggesting
chronic bronchitis.  No focal airspace consolidation.  No blunting
of costophrenic angles.  No pneumothorax.  Mild thoracic scoliosis
convex towards the right.  Stable appearance since previous study.
IMPRESSION: Chronic bronchitic changes.  No evidence of active pulmonary
disease.

## 2015-08-07 ENCOUNTER — Emergency Department (HOSPITAL_BASED_OUTPATIENT_CLINIC_OR_DEPARTMENT_OTHER)
Admission: EM | Admit: 2015-08-07 | Discharge: 2015-08-08 | Disposition: A | Discharge status: Home / Self Care | Payer: No Typology Code available for payment source | Attending: Emergency Medicine | Admitting: Emergency Medicine

## 2015-08-07 ENCOUNTER — Encounter (HOSPITAL_BASED_OUTPATIENT_CLINIC_OR_DEPARTMENT_OTHER): Payer: Self-pay | Admitting: Emergency Medicine

## 2015-08-07 DIAGNOSIS — Z8701 Personal history of pneumonia (recurrent): Secondary | ICD-10-CM | POA: Diagnosis not present

## 2015-08-07 DIAGNOSIS — Z21 Asymptomatic human immunodeficiency virus [HIV] infection status: Secondary | ICD-10-CM | POA: Diagnosis not present

## 2015-08-07 DIAGNOSIS — K029 Dental caries, unspecified: Secondary | ICD-10-CM | POA: Insufficient documentation

## 2015-08-07 DIAGNOSIS — K0889 Other specified disorders of teeth and supporting structures: Secondary | ICD-10-CM | POA: Diagnosis present

## 2015-08-07 DIAGNOSIS — Z720 Tobacco use: Secondary | ICD-10-CM | POA: Insufficient documentation

## 2015-08-07 DIAGNOSIS — F329 Major depressive disorder, single episode, unspecified: Secondary | ICD-10-CM | POA: Insufficient documentation

## 2015-08-07 DIAGNOSIS — Z8614 Personal history of Methicillin resistant Staphylococcus aureus infection: Secondary | ICD-10-CM | POA: Diagnosis not present

## 2015-08-07 DIAGNOSIS — E119 Type 2 diabetes mellitus without complications: Secondary | ICD-10-CM | POA: Insufficient documentation

## 2015-08-07 DIAGNOSIS — Z79899 Other long term (current) drug therapy: Secondary | ICD-10-CM | POA: Diagnosis not present

## 2015-08-07 DIAGNOSIS — F99 Mental disorder, not otherwise specified: Secondary | ICD-10-CM | POA: Insufficient documentation

## 2015-08-07 DIAGNOSIS — Z794 Long term (current) use of insulin: Secondary | ICD-10-CM | POA: Diagnosis not present

## 2015-08-07 NOTE — ED Notes (Signed)
Dental pain on right lower x3 weeks.  Had a dental appt on the 28th but did not have a ride to go.  Now can't get an appt until the 28th of Oct.  Pain worse in past couple days.

## 2015-08-08 MED ORDER — HYDROCODONE-ACETAMINOPHEN 5-325 MG PO TABS: 1.0000 | ORAL_TABLET | Freq: Four times a day (QID) | ORAL | Status: DC | PRN

## 2015-08-08 MED ORDER — PENICILLIN V POTASSIUM 500 MG PO TABS: 500.0000 mg | ORAL_TABLET | Freq: Three times a day (TID) | ORAL | Status: DC

## 2015-08-08 NOTE — ED Provider Notes (Signed)
CSN: [id number]     Arrival date & time 08/07/15  2334 History   First MD Initiated Contact with Patient 08/08/15 0033     Chief Complaint  Patient presents with  . Dental Pain     (Consider location/radiation/quality/duration/timing/severity/associated sxs/prior Treatment) Patient is a 44 y.o. male presenting with tooth pain. The history is provided by the patient.  Dental Pain Location:  Lower Lower teeth location:  31/RL 2nd molar Quality:  Throbbing Severity:  Severe Onset quality:  Sudden Duration:  1 month Timing:  Constant Progression:  Worsening Chronicity:  New Relieved by:  Nothing Worsened by:  Nothing tried Ineffective treatments:  None tried   Past Medical History  Diagnosis Date  . Pneumonia   . Diabetes mellitus   . HIV positive   . MRSA (methicillin resistant Staphylococcus aureus)   . Mental disorder   . Depression    Past Surgical History  Procedure Laterality Date  . Mrsa surgery     No family history on file. Social History  Substance Use Topics  . Smoking status: Current Every Day Smoker -- 0.50 packs/day    Types: Cigarettes  . Smokeless tobacco: None  . Alcohol Use: Yes     Comment: rarely    Review of Systems  All other systems reviewed and are negative.     Allergies  Sulfa antibiotics and Bactrim  Home Medications   Prior to Admission medications   Medication Sig Start Date End Date Taking? Authorizing Provider  atazanavir (REYATAZ) 300 MG capsule Take 300 mg by mouth daily with breakfast.    Historical Provider, MD  citalopram (CELEXA) 10 MG tablet Take 1 tablet (10 mg total) by mouth daily. 02/09/12 02/08/13  Tamala Julian, PA-C  emtricitabine-tenofovir (TRUVADA) 200-300 MG per tablet Take 1 tablet by mouth daily.    Historical Provider, MD  gabapentin (NEURONTIN) 300 MG capsule Take 300 mg by mouth 3 (three) times daily.    Historical Provider, MD  ibuprofen (ADVIL,MOTRIN) 200 MG tablet Take 800 mg by mouth every 6 (six)  hours as needed. Patient used this medication for swelling and pain.    Historical Provider, MD  insulin aspart (NOVOLOG FLEXPEN) 100 UNIT/ML injection Inject 10-25 Units into the skin 3 (three) times daily before meals. According to sliding scale    Historical Provider, MD  insulin glargine (LANTUS SOLOSTAR) 100 UNIT/ML injection Inject 30 Units into the skin at bedtime.     Historical Provider, MD  ritonavir (NORVIR) 100 MG capsule Take 100 mg by mouth daily.      Historical Provider, MD   BP 106/75 mmHg  Pulse 91  Temp(Src) 98.4 F (36.9 C) (Oral)  Resp 16  Ht  (1.753 m)  Wt 210 lb (95.255 kg)  BMI 31.00 kg/m2  SpO2 98% Physical Exam  Constitutional: He is oriented to person, place, and time. He appears well-developed and well-nourished. No distress.  HENT:  Head: Normocephalic and atraumatic.  The right lower second molar has several caries noted. There is mild surrounding gingival inflammation, however no swelling that would be consistent with an abscess. There is no crepitus or swelling of the submental space. There is no trismus.  Neck: Normal range of motion. Neck supple.  Neurological: He is oriented to person, place, and time.  Skin: Skin is warm and dry. He is not diaphoretic.  Nursing note and vitals reviewed.   ED Course  Procedures (including critical care time) Labs Review Labs Reviewed - No data to display  Imaging Review No results found. I have personally reviewed and evaluated these images and lab results as part of my medical decision-making.   EKG Interpretation None      MDM   Final diagnoses:  None    We'll treat with antibiotics and pain medications and follow-up with dentistry. He tells me he has a Education officer, community he sees in [place], and will also provide follow-up information for a local dentist if this does not work out.    Geoffery Lyons, MD 08/08/15 [phone]

## 2015-08-08 NOTE — Discharge Instructions (Signed)
Penicillin as prescribed.  Hydrocodone as prescribed as needed for pain.  Follow-up with dentistry in the next few days.   Dental Pain A tooth ache may be caused by cavities (tooth decay). Cavities expose the nerve of the tooth to air and hot or cold temperatures. It may come from an infection or abscess (also called a boil or furuncle) around your tooth. It is also often caused by dental caries (tooth decay). This causes the pain you are having. DIAGNOSIS  Your caregiver can diagnose this problem by exam. TREATMENT   If caused by an infection, it may be treated with medications which kill germs (antibiotics) and pain medications as prescribed by your caregiver. Take medications as directed.  Only take over-the-counter or prescription medicines for pain, discomfort, or fever as directed by your caregiver.  Whether the tooth ache today is caused by infection or dental disease, you should see your dentist as soon as possible for further care. SEEK MEDICAL CARE IF: The exam and treatment you received today has been provided on an emergency basis only. This is not a substitute for complete medical or dental care. If your problem worsens or new problems (symptoms) appear, and you are unable to meet with your dentist, call or return to this location. SEEK IMMEDIATE MEDICAL CARE IF:   You have a fever.  You develop redness and swelling of your face, jaw, or neck.  You are unable to open your mouth.  You have severe pain uncontrolled by pain medicine. MAKE SURE YOU:   Understand these instructions.  Will watch your condition.  Will get help right away if you are not doing well or get worse. Document Released: 10/24/2005 Document Revised: 01/16/2012 Document Reviewed: 06/11/2008 [facility] Patient Information 2015 [place], [place]. This information is not intended to replace advice given to you by your health care provider. Make sure you discuss any questions you have with your health care  provider.

## 2017-10-11 NOTE — Congregational Nurse Program (Signed)
Congregational Nurse Program Note  Date of Encounter: 09/25/2017  Past Medical History: Past Medical History:  Diagnosis Date  . Depression   . Diabetes mellitus   . HIV positive   . Mental disorder   . MRSA (methicillin resistant Staphylococcus aureus)   . Pneumonia     Encounter Details: CNP Questionnaire - 09/25/17 1433      Questionnaire   Patient Status  Not Applicable    Race  Black or African American    Location Patient Served At  Not Applicable    [employer]  Private Insurance    Uninsured  Not Applicable    Food  Yes, have food insecurities;Within past 12 months, worried food would run out with no money to buy more;Within past 12 months, food ran out with no money to buy more    Housing/Utilities  No permanent housing    Transportation  Yes, need transportation assistance    Interpersonal Safety  No, do not feel physically and emotionally safe where you currently live    Medication  Yes, have medication insecurities    Medical Provider  No    Referrals  Area Agency    ED Visit Averted  Not Applicable    Life-Saving Intervention Made  Not Applicable      Client requesting residential treatment.  Discussed with client options and referred him to [facility] to begin with an assessment by them.

## 2017-11-28 ENCOUNTER — Other Ambulatory Visit: Payer: Self-pay

## 2017-11-28 ENCOUNTER — Ambulatory Visit: Payer: Self-pay

## 2017-11-28 DIAGNOSIS — B20 Human immunodeficiency virus [HIV] disease: Secondary | ICD-10-CM

## 2017-11-29 LAB — URINALYSIS
BILIRUBIN URINE: NEGATIVE
Hgb urine dipstick: NEGATIVE
Ketones, ur: NEGATIVE
LEUKOCYTES UA: NEGATIVE
Nitrite: NEGATIVE
SPECIFIC GRAVITY, URINE: 1.029 (ref 1.001–1.03)
pH: 5.5 (ref 5.0–8.0)

## 2017-11-29 LAB — T-HELPER CELL (CD4) - (RCID CLINIC ONLY)
CD4 T CELL ABS: 130 /uL — AB (ref 400–2700)
CD4 T CELL HELPER: 9 % — AB (ref 33–55)

## 2017-11-29 LAB — URINE CYTOLOGY ANCILLARY ONLY
CHLAMYDIA, DNA PROBE: NEGATIVE
Neisseria Gonorrhea: NEGATIVE

## 2017-11-30 LAB — QUANTIFERON-TB GOLD PLUS
NIL: 0.04 [IU]/mL
QuantiFERON-TB Gold Plus: NEGATIVE
TB1-NIL: 0 IU/mL
TB2-NIL: 0.09 IU/mL

## 2017-12-03 LAB — COMPLETE METABOLIC PANEL WITH GFR
AG Ratio: 1 (calc) (ref 1.0–2.5)
ALT: 35 U/L (ref 9–46)
AST: 37 U/L (ref 10–40)
Albumin: 4 g/dL (ref 3.6–5.1)
Alkaline phosphatase (APISO): 121 U/L — ABNORMAL HIGH (ref 40–115)
BUN: 10 mg/dL (ref 7–25)
CALCIUM: 9.7 mg/dL (ref 8.6–10.3)
CO2: 28 mmol/L (ref 20–32)
CREATININE: 0.79 mg/dL (ref 0.60–1.35)
Chloride: 102 mmol/L (ref 98–110)
GFR, EST NON AFRICAN AMERICAN: 108 mL/min/{1.73_m2} (ref >=60)
GFR, Est African American: 126 mL/min/{1.73_m2} (ref >=60)
Globulin: 3.9 g/dL (calc) — ABNORMAL HIGH (ref 1.9–3.7)
Glucose, Bld: 239 mg/dL — ABNORMAL HIGH (ref 65–99)
POTASSIUM: 4.1 mmol/L (ref 3.5–5.3)
Sodium: 136 mmol/L (ref 135–146)
Total Bilirubin: 0.3 mg/dL (ref 0.2–1.2)
Total Protein: 7.9 g/dL (ref 6.1–8.1)

## 2017-12-03 LAB — LIPID PANEL
CHOL/HDL RATIO: 2.5 (calc) (ref <5.0)
Cholesterol: 95 mg/dL (ref <200)
HDL: 38 mg/dL — AB (ref >40)
LDL CHOLESTEROL (CALC): 42 mg/dL
Non-HDL Cholesterol (Calc): 57 mg/dL (calc) (ref <130)
TRIGLYCERIDES: 69 mg/dL (ref <150)

## 2017-12-03 LAB — HEPATITIS B SURFACE ANTIGEN: Hepatitis B Surface Ag: NONREACTIVE

## 2017-12-03 LAB — HLA B*5701: HLA-B*5701 w/rflx HLA-B High: NEGATIVE

## 2017-12-03 LAB — HEPATITIS C ANTIBODY
Hepatitis C Ab: NONREACTIVE
SIGNAL TO CUT-OFF: 0.08

## 2017-12-03 LAB — CBC WITH DIFFERENTIAL/PLATELET

## 2017-12-03 LAB — RPR: RPR Ser Ql: NONREACTIVE

## 2017-12-03 LAB — HEPATITIS A ANTIBODY, TOTAL: HEPATITIS A AB,TOTAL: NONREACTIVE

## 2017-12-03 LAB — HEPATITIS B CORE ANTIBODY, TOTAL: Hep B Core Total Ab: REACTIVE — AB

## 2017-12-03 LAB — HEPATITIS B SURFACE ANTIBODY,QUALITATIVE: Hep B S Ab: REACTIVE — AB

## 2017-12-05 ENCOUNTER — Encounter: Payer: Self-pay | Admitting: Infectious Diseases

## 2017-12-11 ENCOUNTER — Encounter: Payer: Self-pay | Admitting: Behavioral Health

## 2017-12-12 LAB — RFLX HIV-1 INTEGRASE GENOTYPE: HIV-1 GENOTYPE: DETECTED — AB

## 2017-12-12 LAB — HIV-1 RNA ULTRAQUANT REFLEX TO GENTYP+
HIV 1 RNA Quant: 210000 Copies/mL — ABNORMAL HIGH
HIV-1 RNA Quant, Log: 5.32 Log cps/mL — ABNORMAL HIGH

## 2017-12-13 ENCOUNTER — Ambulatory Visit (INDEPENDENT_AMBULATORY_CARE_PROVIDER_SITE_OTHER): Payer: Self-pay | Admitting: Pharmacist

## 2017-12-13 ENCOUNTER — Other Ambulatory Visit: Payer: Self-pay

## 2017-12-13 ENCOUNTER — Encounter: Payer: Self-pay | Admitting: Infectious Diseases

## 2017-12-13 ENCOUNTER — Ambulatory Visit (INDEPENDENT_AMBULATORY_CARE_PROVIDER_SITE_OTHER): Payer: Self-pay | Admitting: Infectious Diseases

## 2017-12-13 DIAGNOSIS — Z23 Encounter for immunization: Secondary | ICD-10-CM

## 2017-12-13 DIAGNOSIS — B20 Human immunodeficiency virus [HIV] disease: Secondary | ICD-10-CM

## 2017-12-13 DIAGNOSIS — N529 Male erectile dysfunction, unspecified: Secondary | ICD-10-CM | POA: Insufficient documentation

## 2017-12-13 MED ORDER — DAPSONE 100 MG PO TABS: 100.0000 mg | ORAL_TABLET | Freq: Every day | ORAL | 5 refills | Status: DC

## 2017-12-13 MED ORDER — DAPSONE 100 MG PO TABS
100.0000 mg | ORAL_TABLET | Freq: Every day | ORAL | 0 refills | Status: DC
Start: 2017-12-13 — End: 2019-07-23

## 2017-12-13 MED ORDER — BICTEGRAVIR-EMTRICITAB-TENOFOV 50-200-25 MG PO TABS: 1.0000 | ORAL_TABLET | Freq: Every day | ORAL | 5 refills | Status: DC

## 2017-12-13 NOTE — Assessment & Plan Note (Signed)
He has been off his insulin for a while now. Blood sugar on labs 240 with 3+ glucosuria noted. Since he has had 25 lbs weight loss I would expect his requirements for this would be different now. Will check HgbA1C at next lab draw and urine micro/creat at next visit with me.

## 2017-12-13 NOTE — Assessment & Plan Note (Signed)
He has never been on an INSTI regimen per his report and was suppressed on the Reyataz/r + Truvada regimen without difficulty. He is very interested in STR - will start him on Biktarvy today. He has completed HMAP ~2weeks ago. I will provide him 2 weeks of samples until approved. I asked him to call next week to check on status of approval to arrange to pick up this prescription. He met with Cassie today to discuss medicaitons further - will meet with her again in 1 month to reassess VL.   Reviewed VL with Kyle Young today and CD4 count of 130. Needs to start on Dapsone for OI prophylaxis. Kyle Young OP pharmacy will pay for 1 month of this to bridge him until he can get HMAP approved. Rx also sent to [facility] with refills.   He has no signs of opportunistic infection today and generally very healthy. I think his appetite and energy will improve once we suppress his virus again.

## 2017-12-13 NOTE — Progress Notes (Signed)
Name: Kyle Young DOB: 1971-05-05 MRN: [id number] PCP: Patient, No Pcp Per  Chief Complaint  Patient presents with  . New Patient (Initial Visit)    establish care  . HIV Positive/AIDS   Patient Active Problem List   Diagnosis Date Noted  . ED (erectile dysfunction) 12/13/2017  . AIDS (acquired immune deficiency syndrome) ([place]) 02/19/2012  . Diabetes mellitus ([place]) 02/19/2012  . Cocaine abuse ([place]) 02/09/2012  . Alcohol abuse 02/07/2012  . Substance induced mood disorder ([place]) 02/07/2012    Class: Chronic   Subjective:  Kyle Young is a 47 y.o. AA male with HIV infection originally diagnosed with HIV in 2007. He started on medications right away. Cannot recall what CD4 counts have been. History of OIs: none. No history of resistance from what he reports. Was in care in [place] then moved to [place] and was in the care of [facility] system. He has been off ART x 10 months now but previously he was on a regimen of Truvada, Norvir and Reyataz daily. He has always been on this regimen since starting on medications and tells me that he was suppressed consistently while on this. . He has no complaints today aside from fatigue and weight loss - has had at least 25 lbs weight loss over the last 10 months. He has both male and male sexual partners and is currently having unprotected insertive anal/vaginal and oral sex. He does not disclose his status to partners.   He has a history of anal warts/HPV but had rectal biopsy with a colonoscopy about 8 years ago and this was negative for any dysplasia. No family history of colon cancer but was having bowel changes which warranted an early colonoscopy. Apparently had some polyps removed but were benign and recommended follow up after 50.   Review of Systems  Constitutional: Negative for chills, fever, malaise/fatigue and weight loss.  HENT: Negative for sore throat.        No dental problems  Respiratory: Negative for cough and sputum  production.   Cardiovascular: Negative for chest pain and leg swelling.  Gastrointestinal: Negative for abdominal pain, diarrhea and vomiting.  Genitourinary: Negative for dysuria and flank pain.  Musculoskeletal: Negative for joint pain, myalgias and neck pain.  Skin: Negative for rash.  Neurological: Negative for dizziness, tingling and headaches.  Psychiatric/Behavioral: Negative for depression and substance abuse. The patient is not nervous/anxious and does not have insomnia.      Past Medical History:  Diagnosis Date  . Depression   . Diabetes mellitus   . HIV positive ([place])   . Mental disorder   . MRSA (methicillin resistant Staphylococcus aureus)   . Pneumonia    Outpatient Medications Prior to Visit  Medication Sig Dispense Refill  . Insulin Glargine (BASAGLAR KWIKPEN) 100 UNIT/ML SOPN Inject into the skin.    Marland Kitchen insulin lispro (HUMALOG) 100 UNIT/ML injection 20-30 units pe' \\r'$  sliding scale with meals    . citalopram (CELEXA) 10 MG tablet Take 1 tablet (10 mg total) by mouth daily. 30 tablet 0  . gabapentin (NEURONTIN) 300 MG capsule Take 300 mg by mouth 3 (three) times daily.    Marland Kitchen ibuprofen (ADVIL,MOTRIN) 200 MG tablet Take 800 mg by mouth every 6 (six) hours as needed. Patient used this medication for swelling and pain.    Marland Kitchen insulin aspart (NOVOLOG FLEXPEN) 100 UNIT/ML injection Inject 10-25 Units into the skin 3 (three) times daily before meals. According to sliding scale    . insulin glargine (  LANTUS SOLOSTAR) 100 UNIT/ML injection Inject 30 Units into the skin at bedtime.     . traZODone (DESYREL) 100 MG tablet Take by mouth.    Marland Kitchen atazanavir (REYATAZ) 300 MG capsule Take 300 mg by mouth daily with breakfast.    . emtricitabine-tenofovir (TRUVADA) 200-300 MG per tablet Take 1 tablet by mouth daily.    Marland Kitchen HYDROcodone-acetaminophen (NORCO) 5-325 MG tablet Take 1-2 tablets by mouth every 6 (six) hours as needed. 12 tablet 0  . penicillin v potassium (VEETID) 500 MG tablet Take  1 tablet (500 mg total) by mouth 3 (three) times daily. (Patient not taking: Reported on 12/13/2017) 30 tablet 0  . ritonavir (NORVIR) 100 MG capsule Take 100 mg by mouth daily.       No facility-administered medications prior to visit.      Allergies  Allergen Reactions  . Sulfa Antibiotics Itching  . Sulfamethoxazole-Trimethoprim Itching  . Bactrim Rash and Other (See Comments)    Fainting     Social History   Tobacco Use  . Smoking status: Current Every Day Smoker    Packs/day: 0.50    Types: Cigarettes  . Smokeless tobacco: Never Used  Substance Use Topics  . Alcohol use: Yes    Comment: rarely  . Drug use: No    Comment: Quit street drugs 16 months ago.    Family History  Problem Relation Age of Onset  . Diabetes Mother   . Hypertension Mother   . Hypertension Father     Social History   Substance and Sexual Activity  Sexual Activity Yes  . Birth control/protection: None    Physical Exam and Objective Findings:  Vitals:   12/13/17 1337  BP: (!) 148/91  Pulse: 84  Temp: 99 F (37.2 C)  TempSrc: Oral  Weight: 194 lb (88 kg)  Height: 5' 10"  (1.778 m)   Body mass index is 27.84 kg/m.  Physical Exam  Constitutional: He is oriented to person, place, and time and well-developed, well-nourished, and in no distress.  HENT:  Mouth/Throat: No oral lesions. Normal dentition. No dental caries.  Eyes: No scleral icterus.  Cardiovascular: Normal rate, regular rhythm and normal heart sounds.  Pulmonary/Chest: Effort normal and breath sounds normal.  Abdominal: Soft. He exhibits no distension. There is no tenderness.  Lymphadenopathy:    He has no cervical adenopathy.  Neurological: He is alert and oriented to person, place, and time.  Skin: Skin is warm and dry. No rash noted.  Psychiatric: Mood and affect normal.  Vitals reviewed.   Lab Results Lab Results  Component Value Date   WBC CANCELED 11/28/2017   HGB 14.5 03/28/2012   HCT 40.6 03/28/2012    MCV 89.8 03/28/2012   PLT 234 03/28/2012    Lab Results  Component Value Date   CREATININE 0.79 11/28/2017   BUN 10 11/28/2017   NA 136 11/28/2017   K 4.1 11/28/2017   CL 102 11/28/2017   CO2 28 11/28/2017    Lab Results  Component Value Date   ALT 35 11/28/2017   AST 37 11/28/2017   ALKPHOS 76 03/28/2012   BILITOT 0.3 11/28/2017    Lab Results  Component Value Date   CHOL 95 11/28/2017   HDL 38 (L) 11/28/2017   TRIG 69 11/28/2017   CHOLHDL 2.5 11/28/2017   HIV 1 RNA Quant  Date Value  11/28/2017 210,000 Copies/mL (H)  01/25/2011 39 copies/mL (H)  01/23/2011 (H)   26 HIV Genotype cannot be performed due to  low viral load. copies/mL   CD4 T Cell Abs  Date Value  11/28/2017 130 /uL (L)  01/24/2011 290 cmm (L)   No results found for: HAV Lab Results  Component Value Date   HEPBSAG NON-REACTIVE 11/28/2017   HEPBSAB REACTIVE (A) 11/28/2017   No results found for: HCVAB Lab Results  Component Value Date   CHLAMYDIAWP Negative 11/28/2017   N Negative 11/28/2017   No results found for: GCPROBEAPT No results found for: QUANTGOLD No results found for: RPR    Problem List Items Addressed This Visit      Other   AIDS (acquired immune deficiency syndrome) ([place])    He has never been on an INSTI regimen per his report and was suppressed on the Reyataz/r + Truvada regimen without difficulty. He is very interested in STR - will start him on Biktarvy today. He has completed HMAP ~2weeks ago. I will provide him 2 weeks of samples until approved. I asked him to call next week to check on status of approval to arrange to pick up this prescription. He met with Cassie today to discuss medicaitons further - will meet with her again in 1 month to reassess VL.   Reviewed VL with Kyle Young today and CD4 count of 130. Needs to start on Dapsone for OI prophylaxis. [facility] will pay for 1 month of this to bridge him until he can get HMAP approved. Rx also sent to [facility]  with refills.   He has no signs of opportunistic infection today and generally very healthy. I think his appetite and energy will improve once we suppress his virus again.       Relevant Medications   dapsone 100 MG tablet    Other Visit Diagnoses    Need for immunization against influenza       Relevant Orders   Flu Vaccine QUAD 36+ mos IM (Completed)     Janene Madeira, MSN, NP-C [facility] [place] Pager: [phone]  12/22/2017  3:50 PM

## 2017-12-13 NOTE — Progress Notes (Signed)
[facility] Pharmacy Visit  HPI: Kyle Young is a 47 y.o. male who presents to the [facility] pharmacy clinic to initiate care with Kyle Young for his HIV infection.  Patient Active Problem List   Diagnosis Date Noted  . ED (erectile dysfunction) 12/13/2017  . HIV disease ([facility]) 02/19/2012  . Diabetes mellitus 02/19/2012  . Cocaine abuse ([facility]) 02/09/2012  . Alcohol abuse 02/07/2012  . Substance induced mood disorder ([facility]) 02/07/2012    Class: Chronic    Patient's Medications  New Prescriptions   BICTEGRAVIR-EMTRICITABINE-TENOFOVIR AF (BIKTARVY) 50-200-25 MG TABS TABLET    Take 1 tablet by mouth daily.  Previous Medications   CITALOPRAM (CELEXA) 10 MG TABLET    Take 1 tablet (10 mg total) by mouth daily.   GABAPENTIN (NEURONTIN) 300 MG CAPSULE    Take 300 mg by mouth 3 (three) times daily.   HYDROCODONE-ACETAMINOPHEN (NORCO) 5-325 MG TABLET    Take 1-2 tablets by mouth every 6 (six) hours as needed.   IBUPROFEN (ADVIL,MOTRIN) 200 MG TABLET    Take 800 mg by mouth every 6 (six) hours as needed. Patient used this medication for swelling and pain.   INSULIN ASPART (NOVOLOG FLEXPEN) 100 UNIT/ML INJECTION    Inject 10-25 Units into the skin 3 (three) times daily before meals. According to sliding scale   INSULIN GLARGINE (BASAGLAR KWIKPEN) 100 UNIT/ML SOPN    Inject into the skin.   INSULIN GLARGINE (LANTUS SOLOSTAR) 100 UNIT/ML INJECTION    Inject 30 Units into the skin at bedtime.    INSULIN LISPRO (HUMALOG) 100 UNIT/ML INJECTION    20-30 units pe \\r  sliding scale with meals   PENICILLIN V POTASSIUM (VEETID) 500 MG TABLET    Take 1 tablet (500 mg total) by mouth 3 (three) times daily.   TRAZODONE (DESYREL) 100 MG TABLET    Take by mouth.  Modified Medications   No medications on file  Discontinued Medications   ATAZANAVIR (REYATAZ) 300 MG CAPSULE    Take 300 mg by mouth daily with breakfast.   EMTRICITABINE-TENOFOVIR (TRUVADA) 200-300 MG PER TABLET    Take 1 tablet  by mouth daily.   RITONAVIR (NORVIR) 100 MG CAPSULE    Take 100 mg by mouth daily.      Allergies: Allergies  Allergen Reactions  . Sulfa Antibiotics Itching  . Sulfamethoxazole-Trimethoprim Itching  . Bactrim Rash and Other (See Comments)    Fainting     Past Medical History: Past Medical History:  Diagnosis Date  . Depression   . Diabetes mellitus   . HIV positive ([facility])   . Mental disorder   . MRSA (methicillin resistant Staphylococcus aureus)   . Pneumonia     Social History: Social History   Socioeconomic History  . Marital status: Married    Spouse name: Not on file  . Number of children: Not on file  . Years of education: Not on file  . Highest education level: Not on file  Social Needs  . Financial resource strain: Not on file  . Food insecurity - worry: Not on file  . Food insecurity - inability: Not on file  . Transportation needs - medical: Not on file  . Transportation needs - non-medical: Not on file  Occupational History  . Not on file  Tobacco Use  . Smoking status: Current Every Day Smoker    Packs/day: 0.50    Types: Cigarettes  . Smokeless tobacco: Never Used  Substance and Sexual Activity  . Alcohol use:  Yes    Comment: rarely  . Drug use: No    Comment: Quit street drugs 16 months ago.  Marland Kitchen Sexual activity: Not on file  Other Topics Concern  . Not on file  Social History Narrative  . Not on file    Labs: HIV 1 RNA Quant  Date Value  11/28/2017 210,000 Copies/mL (H)  01/25/2011 39 copies/mL (H)  01/23/2011 (H)   26 HIV Genotype cannot be performed due to low viral load. copies/mL   CD4 T Cell Abs  Date Value  11/28/2017 130 /uL (L)  01/24/2011 290 cmm (L)   Hep B S Ab (no units)  Date Value  11/28/2017 REACTIVE (A)   Hepatitis B Surface Ag (no units)  Date Value  11/28/2017 NON-REACTIVE    Lipids:    Component Value Date/Time   CHOL 95 11/28/2017 0931   TRIG 69 11/28/2017 0931   HDL 38 (L) 11/28/2017 0931   CHOLHDL  2.5 11/28/2017 0931    Current HIV Regimen: None  Assessment: Kyle Young is here as a transfer patient to initiate care with Kyle Cornfield for his HIV infection.  He was previously on Reyataz/Norvir + Truvada but has been off of medications x 10 months.  He is uninsured but applied for HMAP ~1 month ago.  Per Kyle Young, he has not been approved just yet. We had samples of Biktarvy here, so we gave him a 14 day supply as he should be approved by then. I will call him in ~10 days to make sure to help with the transitions of care.  I spent time going over [place] including how to take it with or without a meal and not to miss a dose once he starts.  Also told him he should tolerate it just fine.  I gave him my card to call me with any issues.  I will keep track of him to make sure he doesn't have a lapse. He will come back and see me in ~4 weeks.  Plan: - Start Biktarvy PO once daily - F/u with me again 3/11 at 9am - F/u with Stephanie 5/6 at 930am  Kyle Young L. Janyia Guion, PharmD, AAHIVP, CPP Infectious Diseases Clinical Pharmacist [facility] 12/13/2017, 2:53 PM

## 2017-12-13 NOTE — Patient Instructions (Addendum)
I would like to start you on Biktarvy once pill once a day for your HIV.   Biktarvy is the pill for your HIV infection - this will need to be taken once a day around the same time.  - Common side effects for a short time frame usually include headaches, nausea and diarrhea - OK to take over the counter tylenol for headaches and imodium for diarrhea - Try taking with food if you are nauseated   We also need to start you on a medication called Dapsone to protect your immune system while the Biktarvy stops the virus from replicating. This will need to be taken every day and continued for at least 3 months.   Please return to see either Judeth CornfieldStephanie or Pharmacy in 4 weeks and with Judeth CornfieldStephanie again in 3 months.

## 2017-12-19 ENCOUNTER — Encounter: Payer: Self-pay | Admitting: Behavioral Health

## 2018-01-15 ENCOUNTER — Ambulatory Visit (INDEPENDENT_AMBULATORY_CARE_PROVIDER_SITE_OTHER): Payer: Self-pay | Admitting: Pharmacist

## 2018-01-15 ENCOUNTER — Other Ambulatory Visit: Payer: Self-pay | Admitting: Pharmacist

## 2018-01-15 DIAGNOSIS — B20 Human immunodeficiency virus [HIV] disease: Secondary | ICD-10-CM

## 2018-01-15 NOTE — Progress Notes (Signed)
[facility] Visit  HPI: Kyle Young is a 47 y.o. male who presents to the [facility] pharmacy clinic for HIV follow-up.  Patient Active Problem List   Diagnosis Date Noted  . ED (erectile dysfunction) 12/13/2017  . AIDS (acquired immune deficiency syndrome) ([facility]) 02/19/2012  . Diabetes mellitus ([facility]) 02/19/2012  . Cocaine abuse ([facility]) 02/09/2012  . Alcohol abuse 02/07/2012  . Substance induced mood disorder ([facility]) 02/07/2012    Class: Chronic    Patient's Medications  New Prescriptions   No medications on file  Previous Medications   BICTEGRAVIR-EMTRICITABINE-TENOFOVIR AF (BIKTARVY) 50-200-25 MG TABS TABLET    Take 1 tablet by mouth daily.   CITALOPRAM (CELEXA) 10 MG TABLET    Take 1 tablet (10 mg total) by mouth daily.   DAPSONE 100 MG TABLET    Take 1 tablet (100 mg total) by mouth daily.   DAPSONE 100 MG TABLET    Take 1 tablet (100 mg total) by mouth daily.   GABAPENTIN (NEURONTIN) 300 MG CAPSULE    Take 300 mg by mouth 3 (three) times daily.   IBUPROFEN (ADVIL,MOTRIN) 200 MG TABLET    Take 800 mg by mouth every 6 (six) hours as needed. Patient used this medication for swelling and pain.   INSULIN ASPART (NOVOLOG FLEXPEN) 100 UNIT/ML INJECTION    Inject 10-25 Units into the skin 3 (three) times daily before meals. According to sliding scale   INSULIN GLARGINE (BASAGLAR KWIKPEN) 100 UNIT/ML SOPN    Inject into the skin.   INSULIN GLARGINE (LANTUS SOLOSTAR) 100 UNIT/ML INJECTION    Inject 30 Units into the skin at bedtime.    INSULIN LISPRO (HUMALOG) 100 UNIT/ML INJECTION    20-30 units pe \\r  sliding scale with meals   TRAZODONE (DESYREL) 100 MG TABLET    Take by mouth.  Modified Medications   No medications on file  Discontinued Medications   No medications on file    Allergies: Allergies  Allergen Reactions  . Sulfa Antibiotics Itching  . Sulfamethoxazole-Trimethoprim Itching  . Bactrim Rash and Other (See Comments)    Fainting     Past  Medical History: Past Medical History:  Diagnosis Date  . Depression   . Diabetes mellitus   . HIV positive ([facility])   . Mental disorder   . MRSA (methicillin resistant Staphylococcus aureus)   . Pneumonia     Social History: Social History   Socioeconomic History  . Marital status: Single    Spouse name: Not on file  . Number of children: Not on file  . Years of education: Not on file  . Highest education level: Not on file  Social Needs  . Financial resource strain: Hard  . Food insecurity - worry: Sometimes true  . Food insecurity - inability: Sometimes true  . Transportation needs - medical: Not on file  . Transportation needs - non-medical: Not on file  Occupational History  . Not on file  Tobacco Use  . Smoking status: Current Every Day Smoker    Packs/day: 0.50    Types: Cigarettes  . Smokeless tobacco: Never Used  Substance and Sexual Activity  . Alcohol use: Yes    Comment: rarely  . Drug use: No    Comment: Quit street drugs 16 months ago.  Marland Kitchen Sexual activity: Yes    Birth control/protection: None  Other Topics Concern  . Not on file  Social History Narrative  . Not on file    Labs: HIV 1  RNA Quant  Date Value  11/28/2017 210,000 Copies/mL (H)  01/25/2011 39 copies/mL (H)  01/23/2011 (H)   26 HIV Genotype cannot be performed due to low viral load. copies/mL   CD4 T Cell Abs  Date Value  11/28/2017 130 /uL (L)  01/24/2011 290 cmm (L)   Hep B S Ab (no units)  Date Value  11/28/2017 REACTIVE (A)   Hepatitis B Surface Ag (no units)  Date Value  11/28/2017 NON-REACTIVE    Lipids:    Component Value Date/Time   CHOL 95 11/28/2017 0931   TRIG 69 11/28/2017 0931   HDL 38 (L) 11/28/2017 0931   CHOLHDL 2.5 11/28/2017 0931    Current HIV Regimen: Biktarvy  Assessment: Kyle Young is here today to follow-up for his HIV infection.  He was seen by Judeth CornfieldStephanie and myself back on 2/6. He was given a 2 week sample of Biktarvy and received a 30 day  immediate access card as well. He has had no lapse in medication and has missed 2 doses. His ADAP was approved on March 5th.  I counseled him on missed doses and told him to try and not miss anymore.  He is tolerating the Biktarvy very well - he loves it compared to the previous therapies he has been on in the past. No side effects whatsoever.  He will see Judeth CornfieldStephanie in May.  I will get a HIV viral load and CD4 today.  He states he feels much better being back on ARTs.   Plan: - Continue Biktarvy PO once daily - HIV viral load and CD4 today - F/u with Judeth CornfieldStephanie 5/7 at 945am  Kyle Young, PharmD, AAHIVP, CPP Infectious Diseases Clinical Pharmacist [facility] 01/15/2018, 9:35 AM

## 2018-01-16 LAB — T-HELPER CELL (CD4) - (RCID CLINIC ONLY)
CD4 T CELL ABS: 220 /uL — AB (ref 400–2700)
CD4 T CELL HELPER: 9 % — AB (ref 33–55)

## 2018-01-17 LAB — HIV-1 RNA QUANT-NO REFLEX-BLD
HIV 1 RNA Quant: 60 copies/mL — ABNORMAL HIGH
HIV-1 RNA Quant, Log: 1.78 Log copies/mL — ABNORMAL HIGH

## 2018-02-13 ENCOUNTER — Telehealth: Payer: Self-pay | Admitting: Pharmacist Clinician (PhC)/ Clinical Pharmacy Specialist

## 2018-02-13 ENCOUNTER — Ambulatory Visit (INDEPENDENT_AMBULATORY_CARE_PROVIDER_SITE_OTHER): Payer: Self-pay | Admitting: Infectious Diseases

## 2018-02-13 VITALS — BP 136/83 | HR 85 | Temp 98.1°F | Wt 194.0 lb

## 2018-02-13 DIAGNOSIS — R197 Diarrhea, unspecified: Secondary | ICD-10-CM

## 2018-02-13 DIAGNOSIS — B20 Human immunodeficiency virus [HIV] disease: Secondary | ICD-10-CM

## 2018-02-13 DIAGNOSIS — R112 Nausea with vomiting, unspecified: Secondary | ICD-10-CM

## 2018-02-13 MED ORDER — ONDANSETRON HCL 4 MG PO TABS: 4.0000 mg | ORAL_TABLET | Freq: Three times a day (TID) | ORAL | 0 refills | Status: DC | PRN

## 2018-02-13 MED ORDER — VANCOMYCIN HCL 125 MG PO CAPS: 125.0000 mg | ORAL_CAPSULE | Freq: Four times a day (QID) | ORAL | 0 refills | Status: AC

## 2018-02-13 NOTE — Patient Instructions (Addendum)
Will run some tests on your stool sample.   For now I will send in some zofran for nausea if you need it.   Continue taking fluids and foods in as you can - you are doing a good job with this.   I anticipate we will need to start you on some antibiotic for your symptoms.   If you can help it try to not take any anti-diarrhea medications until we get your test results back.  This is not related to your Biktarvy. Please try to continue taking this.

## 2018-02-13 NOTE — Telephone Encounter (Signed)
Thank you Minh.  

## 2018-02-13 NOTE — Assessment & Plan Note (Addendum)
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 Will check stool c diff GDH/PRR today as well as stool O&P and culture. We talked about my suspicion and going ahead and starting Vancomycin QID for him until testing results. He would like to start this now. I advised to not take more than 4 imodium daily and actually stop taking this for now if he can until we rule out infectious cause.

## 2018-02-13 NOTE — Telephone Encounter (Signed)
Pt complained that he has had a big issue with diarrhea. He thought that it was due to [facility] and stopped it 2 wks ago but it has continue. Complained of a lot of weight loss. Set him up to see [place]phanie today.

## 2018-02-13 NOTE — Progress Notes (Signed)
Name: Kyle Young DOB: [birth year] MRN: [id number] PCP: Patient, No Pcp Per  Chief Complaint  Patient presents with  . Follow-up    nausea , diarrhea x10-15 times a day, cold sweats, "fever"  for 16 days, stopped taking Biktarvy for 16 days   Patient Active Problem List   Diagnosis Date Noted  . Diarrhea of presumed infectious origin 02/13/2018  . ED (erectile dysfunction) 12/13/2017  . AIDS (acquired immune deficiency syndrome) (HCC) 02/19/2012  . Diabetes mellitus (HCC) 02/19/2012  . Alcohol abuse 02/07/2012  . Substance induced mood disorder (HCC) 02/07/2012    Class: Chronic   Subjective:  Brief Narrative: Kyle Young is a 47 y.o. AA male with HIV infection originally diagnosed with HIV in 2007. He started on medications right away. CD4 nadir unknown. HIV Risk: bisexual. History of OIs: HPV. Was in care in [place] then moved to [place] and was in the care of [place] system.   Previous Regimen:  Truvada, Norvir + Reyataz   Genotype:   None per previous provider   HPI/ROS: [name] is here today for acute visit for prolonged diarrhea, weight loss and nausea. Sixteen days ago he started with severe vomiting, abdominal pain, diarrhea. At five days into the illness vomiting improved but continued with diarrhea as well as subjective fevers and chillls. He recently had started [facility] for his HIV and presumed it to be related to this and stopped it immediately; however the diarrhea has continued. Describes it to be watery and 8-10 episodes daily. He has urgency associated with episodes. He has been taking up to "10 imodium daily" for symptoms and reports it is finally starting to firm up with that. Estimates he has lost ~6-10# in the last 2 weeks. He has some associated dizziness, occasional nausea now and some headaches. He works in [employer] and feels he may have been exposed to something at work.   Review of Systems  Constitutional: Positive for chills, fever and  weight loss. Negative for malaise/fatigue.  HENT: Negative for sore throat.        No dental problems  Respiratory: Negative for cough and sputum production.   Cardiovascular: Negative for chest pain and leg swelling.  Gastrointestinal: Positive for abdominal pain, diarrhea, nausea and vomiting. Negative for blood in stool.  Genitourinary: Negative for dysuria and flank pain.  Musculoskeletal: Negative for joint pain, myalgias and neck pain.  Skin: Negative for rash.  Neurological: Negative for dizziness, tingling and headaches.  Psychiatric/Behavioral: Negative for depression and substance abuse. The patient is not nervous/anxious and does not have insomnia.      Past Medical History:  Diagnosis Date  . Depression   . Diabetes mellitus   . HIV positive (HCC)   . Mental disorder   . MRSA (methicillin resistant Staphylococcus aureus)   . Pneumonia    Outpatient Medications Prior to Visit  Medication Sig Dispense Refill  . bictegravir-emtricitabine-tenofovir AF (BIKTARVY) 50-200-25 MG TABS tablet Take 1 tablet by mouth daily. 30 tablet 5  . citalopram (CELEXA) 10 MG tablet Take 1 tablet (10 mg total) by mouth daily. 30 tablet 0  . dapsone 100 MG tablet Take 1 tablet (100 mg total) by mouth daily. (Patient not taking: Reported on 02/13/2018) 30 tablet 0  . dapsone 100 MG tablet Take 1 tablet (100 mg total) by mouth daily. (Patient not taking: Reported on 02/13/2018) 30 tablet 5  . gabapentin (NEURONTIN) 300 MG capsule Take 300 mg by mouth 3 (three) times daily.    Marland Kitchen  ibuprofen (ADVIL,MOTRIN) 200 MG tablet Take 800 mg by mouth every 6 (six) hours as needed. Patient used this medication for swelling and pain.    Marland Kitchen. insulin aspart (NOVOLOG FLEXPEN) 100 UNIT/ML injection Inject 10-25 Units into the skin 3 (three) times daily before meals. According to sliding scale    . Insulin Glargine (BASAGLAR KWIKPEN) 100 UNIT/ML SOPN Inject into the skin.    Marland Kitchen. insulin glargine (LANTUS SOLOSTAR) 100 UNIT/ML  injection Inject 30 Units into the skin at bedtime.     . insulin lispro (HUMALOG) 100 UNIT/ML injection 20-30 units pe \\r  sliding scale with meals    . traZODone (DESYREL) 100 MG tablet Take by mouth.     No facility-administered medications prior to visit.      Allergies  Allergen Reactions  . Sulfa Antibiotics Itching  . Sulfamethoxazole-Trimethoprim Itching  . Bactrim Rash and Other (See Comments)    Fainting     Social History   Tobacco Use  . Smoking status: Current Every Day Smoker    Packs/day: 0.50    Types: Cigarettes  . Smokeless tobacco: Never Used  Substance Use Topics  . Alcohol use: Yes    Comment: rarely  . Drug use: No    Types: Cocaine    Comment: Quit street drugs 16 months ago.    Family History  Problem Relation Age of Onset  . Diabetes Mother   . Hypertension Mother   . Hypertension Father     Social History   Substance and Sexual Activity  Sexual Activity Yes  . Birth control/protection: None    Physical Exam and Objective Findings:  Vitals:   02/13/18 1557  BP: 136/83  Pulse: 85  Temp: 98.1 F (36.7 C)  TempSrc: Oral  Weight: 194 lb (88 kg)   Body mass index is 27.84 kg/m.  Physical Exam  Constitutional: He is oriented to person, place, and time and well-developed, well-nourished, and in no distress.  HENT:  Mouth/Throat: No oral lesions. Normal dentition. No dental caries.  Eyes: No scleral icterus.  Cardiovascular: Normal rate, regular rhythm and normal heart sounds.  Pulmonary/Chest: Effort normal and breath sounds normal.  Abdominal: Soft. He exhibits no distension. There is tenderness.  Lymphadenopathy:    He has no cervical adenopathy.  Neurological: He is alert and oriented to person, place, and time.  Skin: Skin is warm and dry. No rash noted.  Psychiatric: Mood and affect normal.  Vitals reviewed. Stool sample assessed to be toothpaste consistency, pale.   Lab Results Lab Results  Component Value Date   WBC  CANCELED 11/28/2017   HGB 14.5 03/28/2012   HCT 40.6 03/28/2012   MCV 89.8 03/28/2012   PLT 234 03/28/2012    Lab Results  Component Value Date   CREATININE 0.79 11/28/2017   BUN 10 11/28/2017   NA 136 11/28/2017   K 4.1 11/28/2017   CL 102 11/28/2017   CO2 28 11/28/2017    Lab Results  Component Value Date   ALT 35 11/28/2017   AST 37 11/28/2017   ALKPHOS 76 03/28/2012   BILITOT 0.3 11/28/2017    Lab Results  Component Value Date   CHOL 95 11/28/2017   HDL 38 (L) 11/28/2017   LDLCALC 42 11/28/2017   TRIG 69 11/28/2017   CHOLHDL 2.5 11/28/2017   HIV 1 RNA Quant  Date Value  01/15/2018 60 copies/mL (H)  11/28/2017 210,000 Copies/mL (H)  01/25/2011 39 copies/mL (H)   CD4 T Cell Abs  Date Value  01/15/2018 220 /uL (L)  11/28/2017 130 /uL (L)  01/24/2011 290 cmm (L)   Lab Results  Component Value Date   HAV NON-REACTIVE 11/28/2017   Lab Results  Component Value Date   HEPBSAG NON-REACTIVE 11/28/2017   HEPBSAB REACTIVE (A) 11/28/2017   No results found for: HCVAB Lab Results  Component Value Date   CHLAMYDIAWP Negative 11/28/2017   N Negative 11/28/2017   No results found for: GCPROBEAPT No results found for: QUANTGOLD No results found for: RPR    Problem List Items Addressed This Visit      Digestive   Diarrhea of presumed infectious origin - Primary    Although he has had no recent exposure to antibiotics I have strong assumption he has community acquired CDiff considering his course of illness vs alternative infectious pathogen. Will check stool c diff GDH/PRR today as well as stool O&P and culture. We talked about my suspicion and going ahead and starting Vancomycin QID for him until testing results. He would like to start this now. I advised to not take more than 4 imodium daily and actually stop taking this for now if he can until we rule out infectious cause.       Relevant Orders   C. difficile GDH and Toxin A/B (Completed)   Stool Culture  (Completed)     Other   AIDS (acquired immune deficiency syndrome) (HCC)    Advised to continue with his Biktarvy as this is not likely related to his ART. Reviewed labs and he has had excellent reduction in viral load with this medication from 210,000 to now only 60.         Rexene Alberts, MSN, NP-C [facility] for Infectious Disease [facility] Pager: [phone]

## 2018-02-14 NOTE — Progress Notes (Signed)
CDiff testing all negative. When I called the patient to see how he was feeling and recommend changing his vancomycin he remarked that he was feeling "immensely better" and reported less frequency in stools and volume of stools. 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 Will check Hep A total Ab next visit. For now he would like to continue vancomycin.

## 2018-02-17 LAB — STOOL CULTURE
MICRO NUMBER: 90436324
MICRO NUMBER: 90436325
MICRO NUMBER:: 90436322
SHIGA RESULT:: NOT DETECTED
SPECIMEN QUALITY: ADEQUATE
SPECIMEN QUALITY: ADEQUATE
SPECIMEN QUALITY:: ADEQUATE

## 2018-02-17 LAB — C. DIFFICILE GDH AND TOXIN A/B
GDH ANTIGEN: NOT DETECTED
MICRO NUMBER:: 90436323
SPECIMEN QUALITY:: ADEQUATE
TOXIN A AND B: NOT DETECTED

## 2018-02-17 LAB — TIQ-NTM

## 2018-02-22 DIAGNOSIS — R112 Nausea with vomiting, unspecified: Secondary | ICD-10-CM | POA: Insufficient documentation

## 2018-02-22 NOTE — Assessment & Plan Note (Signed)
Will send in rx for zofran.

## 2018-02-22 NOTE — Assessment & Plan Note (Signed)
Advised to continue with his Biktarvy as this is not likely related to his ART. Reviewed labs and he has had excellent reduction in viral load with this medication from 210,000 to now only 60.

## 2018-03-12 ENCOUNTER — Ambulatory Visit: Payer: Self-pay | Admitting: Infectious Diseases

## 2018-03-13 ENCOUNTER — Ambulatory Visit: Payer: Self-pay | Admitting: Infectious Diseases

## 2018-11-13 ENCOUNTER — Other Ambulatory Visit: Payer: Self-pay | Admitting: Behavioral Health

## 2018-11-13 ENCOUNTER — Encounter: Payer: Self-pay | Admitting: Behavioral Health

## 2018-11-13 ENCOUNTER — Other Ambulatory Visit: Payer: Self-pay

## 2018-11-13 DIAGNOSIS — Z113 Encounter for screening for infections with a predominantly sexual mode of transmission: Secondary | ICD-10-CM

## 2018-11-13 DIAGNOSIS — Z79899 Other long term (current) drug therapy: Secondary | ICD-10-CM

## 2018-11-13 DIAGNOSIS — B20 Human immunodeficiency virus [HIV] disease: Secondary | ICD-10-CM

## 2018-11-13 MED ORDER — BICTEGRAVIR-EMTRICITAB-TENOFOV 50-200-25 MG PO TABS: 1.0000 | ORAL_TABLET | Freq: Every day | ORAL | 0 refills | Status: DC

## 2018-11-14 ENCOUNTER — Ambulatory Visit: Payer: Self-pay

## 2018-11-14 LAB — URINE CYTOLOGY ANCILLARY ONLY
Chlamydia: NEGATIVE
NEISSERIA GONORRHEA: NEGATIVE

## 2018-11-14 LAB — T-HELPER CELL (CD4) - (RCID CLINIC ONLY)
CD4 % Helper T Cell: 11 % — ABNORMAL LOW (ref 33–55)
CD4 T Cell Abs: 180 /uL — ABNORMAL LOW (ref 400–2700)

## 2018-11-15 ENCOUNTER — Telehealth: Payer: Self-pay

## 2018-11-15 LAB — CBC WITH DIFFERENTIAL/PLATELET
Absolute Monocytes: 342 cells/uL (ref 200–950)
Basophils Absolute: 20 cells/uL (ref 0–200)
Basophils Relative: 0.4 %
Eosinophils Absolute: 82 cells/uL (ref 15–500)
Eosinophils Relative: 1.6 %
HEMATOCRIT: 38.5 % (ref 38.5–50.0)
Hemoglobin: 13.2 g/dL (ref 13.2–17.1)
Lymphs Abs: 1448 cells/uL (ref 850–3900)
MCH: 31.4 pg (ref 27.0–33.0)
MCHC: 34.3 g/dL (ref 32.0–36.0)
MCV: 91.4 fL (ref 80.0–100.0)
MPV: 11.4 fL (ref 7.5–12.5)
Monocytes Relative: 6.7 %
Neutro Abs: 3208 cells/uL (ref 1500–7800)
Neutrophils Relative %: 62.9 %
Platelets: 281 10*3/uL (ref 140–400)
RBC: 4.21 10*6/uL (ref 4.20–5.80)
RDW: 11.9 % (ref 11.0–15.0)
Total Lymphocyte: 28.4 %
WBC: 5.1 10*3/uL (ref 3.8–10.8)

## 2018-11-15 LAB — HIV-1 RNA QUANT-NO REFLEX-BLD
HIV 1 RNA Quant: 7110 copies/mL — ABNORMAL HIGH
HIV-1 RNA QUANT, LOG: 3.85 {Log_copies}/mL — AB

## 2018-11-15 LAB — COMPREHENSIVE METABOLIC PANEL
AG Ratio: 1.3 (calc) (ref 1.0–2.5)
ALT: 24 U/L (ref 9–46)
AST: 17 U/L (ref 10–40)
Albumin: 4.1 g/dL (ref 3.6–5.1)
Alkaline phosphatase (APISO): 120 U/L — ABNORMAL HIGH (ref 40–115)
BILIRUBIN TOTAL: 0.3 mg/dL (ref 0.2–1.2)
BUN: 14 mg/dL (ref 7–25)
CO2: 29 mmol/L (ref 20–32)
Calcium: 9.8 mg/dL (ref 8.6–10.3)
Chloride: 103 mmol/L (ref 98–110)
Creat: 0.91 mg/dL (ref 0.60–1.35)
Globulin: 3.1 g/dL (calc) (ref 1.9–3.7)
Glucose, Bld: 327 mg/dL — ABNORMAL HIGH (ref 65–99)
Potassium: 4.4 mmol/L (ref 3.5–5.3)
Sodium: 138 mmol/L (ref 135–146)
Total Protein: 7.2 g/dL (ref 6.1–8.1)

## 2018-11-15 LAB — LIPID PANEL
CHOLESTEROL: 139 mg/dL (ref <200)
HDL: 32 mg/dL — ABNORMAL LOW (ref >40)
LDL Cholesterol (Calc): 78 mg/dL (calc)
Non-HDL Cholesterol (Calc): 107 mg/dL (calc) (ref <130)
TRIGLYCERIDES: 202 mg/dL — AB (ref <150)
Total CHOL/HDL Ratio: 4.3 (calc) (ref <5.0)

## 2018-11-15 LAB — RPR: RPR Ser Ql: NONREACTIVE

## 2018-11-15 NOTE — Telephone Encounter (Signed)
-----   Message from Blanchard Kelch, NP sent at 11/15/2018 11:56 AM EST ----- Please reach out to [facility] to check in on him. His viral load is up to 7,000 again and not taking HIV medication. His blood sugar is also very high and almost 350. Please offer earlier appointment with me as soon as he can come (currently not scheduled until February) and ensure he has ADAP re-enrollment for January set up.  Thank you!

## 2018-11-15 NOTE — Telephone Encounter (Signed)
Per Rexene Alberts, Np called patient to schedule an earlier appointment to be seen. Patient's viral load is up as well as his blood sugar.  Call attempted to reach patient, but patient not able to talk due to being at work. Patient will call office back at a later time.  Lorenso Courier, [place]

## 2018-11-17 ENCOUNTER — Encounter (HOSPITAL_COMMUNITY): Payer: Self-pay

## 2018-11-17 ENCOUNTER — Emergency Department (HOSPITAL_COMMUNITY)
Admission: EM | Admit: 2018-11-17 | Discharge: 2018-11-17 | Disposition: A | Discharge status: Home / Self Care | Payer: Self-pay | Attending: Emergency Medicine | Admitting: Emergency Medicine

## 2018-11-17 DIAGNOSIS — Y999 Unspecified external cause status: Secondary | ICD-10-CM | POA: Insufficient documentation

## 2018-11-17 DIAGNOSIS — E119 Type 2 diabetes mellitus without complications: Secondary | ICD-10-CM | POA: Insufficient documentation

## 2018-11-17 DIAGNOSIS — S0181XA Laceration without foreign body of other part of head, initial encounter: Secondary | ICD-10-CM

## 2018-11-17 DIAGNOSIS — Z23 Encounter for immunization: Secondary | ICD-10-CM | POA: Insufficient documentation

## 2018-11-17 DIAGNOSIS — Y939 Activity, unspecified: Secondary | ICD-10-CM | POA: Insufficient documentation

## 2018-11-17 DIAGNOSIS — Z79899 Other long term (current) drug therapy: Secondary | ICD-10-CM | POA: Insufficient documentation

## 2018-11-17 DIAGNOSIS — F17210 Nicotine dependence, cigarettes, uncomplicated: Secondary | ICD-10-CM | POA: Insufficient documentation

## 2018-11-17 DIAGNOSIS — Z794 Long term (current) use of insulin: Secondary | ICD-10-CM | POA: Insufficient documentation

## 2018-11-17 DIAGNOSIS — W19XXXA Unspecified fall, initial encounter: Secondary | ICD-10-CM | POA: Insufficient documentation

## 2018-11-17 DIAGNOSIS — S01511A Laceration without foreign body of lip, initial encounter: Secondary | ICD-10-CM | POA: Insufficient documentation

## 2018-11-17 DIAGNOSIS — Y92811 Bus as the place of occurrence of the external cause: Secondary | ICD-10-CM | POA: Insufficient documentation

## 2018-11-17 MED ORDER — BACITRACIN ZINC 500 UNIT/GM EX OINT
TOPICAL_OINTMENT | Freq: Two times a day (BID) | CUTANEOUS | Status: DC
  Administered 2018-11-17: 10:00:00 via TOPICAL
  Filled 2018-11-17: qty 0.9

## 2018-11-17 MED ORDER — TETANUS-DIPHTH-ACELL PERTUSSIS 5-2.5-18.5 LF-MCG/0.5 IM SUSP
0.5000 mL | Freq: Once | INTRAMUSCULAR | Status: AC
  Administered 2018-11-17: 0.5 mL via INTRAMUSCULAR
  Filled 2018-11-17: qty 0.5

## 2018-11-17 MED ORDER — LIDOCAINE-EPINEPHRINE (PF) 2 %-1:200000 IJ SOLN
10.0000 mL | Freq: Once | INTRAMUSCULAR | Status: AC
  Administered 2018-11-17: 10 mL
  Filled 2018-11-17: qty 20

## 2018-11-17 MED ORDER — ACETAMINOPHEN 325 MG PO TABS
650.0000 mg | ORAL_TABLET | Freq: Once | ORAL | Status: AC
  Administered 2018-11-17: 650 mg via ORAL
  Filled 2018-11-17: qty 2
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
## 2018-11-17 NOTE — Discharge Instructions (Addendum)
The Dermabond and Steri-Strips will peel off when appropriate. Return to ED for worsening symptoms, redness at the site, swelling, pus draining from the area or fever.

## 2018-11-17 NOTE — ED Notes (Signed)
Patient verbalizes understanding of discharge instructions. Opportunity for questioning and answers were provided. Armband removed by staff, pt discharged from ED ambulatory.   

## 2018-11-19 ENCOUNTER — Ambulatory Visit: Payer: Self-pay | Admitting: Infectious Diseases

## 2018-12-09 ENCOUNTER — Other Ambulatory Visit: Payer: Self-pay | Admitting: Infectious Diseases

## 2018-12-09 DIAGNOSIS — B20 Human immunodeficiency virus [HIV] disease: Secondary | ICD-10-CM

## 2018-12-10 NOTE — Telephone Encounter (Signed)
Refill request held until appointment 12/11/18.

## 2018-12-11 ENCOUNTER — Encounter: Payer: Self-pay | Admitting: Infectious Diseases

## 2018-12-11 ENCOUNTER — Ambulatory Visit (INDEPENDENT_AMBULATORY_CARE_PROVIDER_SITE_OTHER): Payer: Self-pay | Admitting: Infectious Diseases

## 2018-12-11 VITALS — BP 129/87 | HR 84 | Temp 98.3°F | Ht 70.0 in | Wt 199.0 lb

## 2018-12-11 DIAGNOSIS — E119 Type 2 diabetes mellitus without complications: Secondary | ICD-10-CM

## 2018-12-11 DIAGNOSIS — B20 Human immunodeficiency virus [HIV] disease: Secondary | ICD-10-CM

## 2018-12-11 DIAGNOSIS — Z794 Long term (current) use of insulin: Secondary | ICD-10-CM

## 2018-12-11 DIAGNOSIS — H538 Other visual disturbances: Secondary | ICD-10-CM

## 2018-12-11 DIAGNOSIS — R768 Other specified abnormal immunological findings in serum: Secondary | ICD-10-CM

## 2018-12-11 MED ORDER — INSULIN ASPART 100 UNIT/ML ~~LOC~~ SOLN: 10.0000 [IU] | Freq: Three times a day (TID) | SUBCUTANEOUS | 0 refills | Status: DC

## 2018-12-11 MED ORDER — BASAGLAR KWIKPEN 100 UNIT/ML ~~LOC~~ SOPN: 30.0000 [IU] | PEN_INJECTOR | Freq: Every day | SUBCUTANEOUS | 0 refills | Status: DC

## 2018-12-11 MED ORDER — DAPSONE 100 MG PO TABS: 100.0000 mg | ORAL_TABLET | Freq: Every day | ORAL | 5 refills | Status: DC

## 2018-12-11 MED ORDER — BICTEGRAVIR-EMTRICITAB-TENOFOV 50-200-25 MG PO TABS: 1.0000 | ORAL_TABLET | Freq: Every day | ORAL | 5 refills | Status: DC

## 2018-12-11 NOTE — Assessment & Plan Note (Signed)
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  Last had Hep C Ab screen in 2019.  LFTs normal on recent lab work.

## 2018-12-11 NOTE — Assessment & Plan Note (Signed)
Out of care - reviewed his labs in detail today. He will also meet with Selena Batten to determine eligibility for long acting injectable ACTG study. I think he will do well with this. Will check genotype today with integrase to determine elligilibity.  Refill biktarvy and dapsone.  STI screening negative recently.

## 2018-12-11 NOTE — Progress Notes (Signed)
Name: Kyle Young DOB: 05/31/1971 MRN: [id number] PCP: Patient, No Pcp Per   Patient Active Problem List   Diagnosis Date Noted  . Hepatitis B core antibody positive 12/11/2018  . Nausea & vomiting 02/22/2018  . Diarrhea of presumed infectious origin 02/13/2018  . ED (erectile dysfunction) 12/13/2017  . AIDS (acquired immune deficiency syndrome) (HCC) 02/19/2012  . Diabetes mellitus (HCC) 02/19/2012  . Alcohol abuse 02/07/2012  . Substance induced mood disorder (HCC) 02/07/2012    Class: Chronic   Subjective:  Brief Narrative: Kyle Young is a 48 y.o. AA male with HIV infection originally diagnosed with HIV in 2007. He started on medications right away. CD4 nadir unknown. HIV Risk: bisexual. History of OIs: HPV. [place] in [place] in [place] then [place] and was in the [facility] of [facility].   Previous Regimen:  Truvada, Norvir + Reyataz   Biktarvy   Genotype:   None per previous provider   HPI/ROS: Here to re-enter care for HIV. He has had a very hard time with pill adherence and readily admits this. He contacted [facility] case manager when he returned from rehab (cocaine) with hopes to enroll in long-acting injection study for HAART. He feels this would make him much more successful in taking care of himself. Reports no complaints today suggestive of associated opportunistic infection or advancing HIV disease such as fevers, night sweats, weight loss, anorexia, cough, SOB, nausea, vomiting, diarrhea, headache, sensory changes, lymphadenopathy or oral thrush. Presently living in a halfway house and engaging in therapeutic groups which is helpful for his sobriety.   He has been out of his diabetes medications - has the syringes and glucometer equipment and has been monitoring blood sugars at home. Does not give me a value specifically but sounds like they have been > 400 frequently. He has blurry vision that he is worried about that may be due to his diabetes; cannot see things  far away and needs to bring in objects close to him. He is thirsty all the time and urinates frequently. Has not had any weight loss.   Review of Systems  Constitutional: Negative for chills, fever, malaise/fatigue and weight loss.  HENT: Negative for sore throat.        No dental problems  Eyes: Positive for blurred vision.  Respiratory: Negative for cough and sputum production.   Cardiovascular: Negative for chest pain and leg swelling.  Gastrointestinal: Negative for abdominal pain, diarrhea and vomiting.  Genitourinary: Negative for dysuria and flank pain.  Musculoskeletal: Negative for joint pain, myalgias and neck pain.  Skin: Negative for rash.  Neurological: Negative for dizziness, tingling and headaches.  Endo/Heme/Allergies: Positive for polydipsia.  Psychiatric/Behavioral: Negative for depression and substance abuse. The patient is not nervous/anxious and does not have insomnia.      Past Medical History:  Diagnosis Date  . Depression   . Diabetes mellitus   . HIV positive (HCC)   . Mental disorder   . MRSA (methicillin resistant Staphylococcus aureus)   . Pneumonia    Outpatient Medications Prior to Visit  Medication Sig Dispense Refill  . ibuprofen (ADVIL,MOTRIN) 200 MG tablet Take 800 mg by mouth every 6 (six) hours as needed. Patient used this medication for swelling and pain.    Marland Kitchen. insulin lispro (HUMALOG) 100 UNIT/ML injection 20-30 units pe \\r  sliding scale with meals    . BIKTARVY 50-200-25 MG TABS tablet TAKE 1 TABLET BY MOUTH DAILY 30 tablet 0  . insulin aspart (NOVOLOG FLEXPEN) 100  UNIT/ML injection Inject 10-25 Units into the skin 3 (three) times daily before meals. According to sliding scale    . Insulin Glargine (BASAGLAR KWIKPEN) 100 UNIT/ML SOPN Inject into the skin.    Marland Kitchen insulin glargine (LANTUS SOLOSTAR) 100 UNIT/ML injection Inject 30 Units into the skin at bedtime.     . citalopram (CELEXA) 10 MG tablet Take 1 tablet (10 mg total) by mouth daily. 30  tablet 0  . dapsone 100 MG tablet Take 1 tablet (100 mg total) by mouth daily. (Patient not taking: Reported on 02/13/2018) 30 tablet 0  . gabapentin (NEURONTIN) 300 MG capsule Take 300 mg by mouth 3 (three) times daily.    . ondansetron (ZOFRAN) 4 MG tablet Take 1 tablet (4 mg total) by mouth every 8 (eight) hours as needed for nausea or vomiting. (Patient not taking: Reported on 12/11/2018) 20 tablet 0  . traZODone (DESYREL) 100 MG tablet Take by mouth.    . dapsone 100 MG tablet Take 1 tablet (100 mg total) by mouth daily. (Patient not taking: Reported on 02/13/2018) 30 tablet 5   No facility-administered medications prior to visit.      Allergies  Allergen Reactions  . Sulfa Antibiotics Itching  . Sulfamethoxazole-Trimethoprim Itching  . Bactrim Rash and Other (See Comments)    Fainting     Social History   Tobacco Use  . Smoking status: Former Smoker    Packs/day: 0.50    Types: Cigarettes    Last attempt to quit: 09/10/2018    Years since quitting: 0.2  . Smokeless tobacco: Never Used  Substance Use Topics  . Alcohol use: Yes    Comment: rarely  . Drug use: No    Types: Cocaine    Comment: Quit street drugs 16 months ago.    Family History  Problem Relation Age of Onset  . Diabetes Mother   . Hypertension Mother   . Hypertension Father     Social History   Substance and Sexual Activity  Sexual Activity Yes  . Birth control/protection: None    Physical Exam and Objective Findings:  Vitals:   12/11/18 1034  BP: 129/87  Pulse: 84  Temp: 98.3 F (36.8 C)  TempSrc: Oral  Weight: 199 lb (90.3 kg)  Height: 5\' 10"  (1.778 m)   Body mass index is 28.55 kg/m.  Physical Exam Constitutional:      Appearance: He is well-developed.     Comments: Seated comfortably in chair during visit.   HENT:     Mouth/Throat:     Mouth: Mucous membranes are moist.     Dentition: Normal dentition. No dental abscesses.     Pharynx: Oropharynx is clear. No oropharyngeal  exudate.  Eyes:     General: No scleral icterus. Cardiovascular:     Rate and Rhythm: Normal rate and regular rhythm.     Heart sounds: Normal heart sounds.  Pulmonary:     Effort: Pulmonary effort is normal.     Breath sounds: Normal breath sounds.  Abdominal:     General: There is no distension.     Palpations: Abdomen is soft.     Tenderness: There is no abdominal tenderness.  Lymphadenopathy:     Cervical: No cervical adenopathy.  Skin:    General: Skin is warm and dry.     Findings: No rash.  Neurological:     Mental Status: He is alert and oriented to person, place, and time.  Psychiatric:  Mood and Affect: Mood normal.        Judgment: Judgment normal.    Lab Results Lab Results  Component Value Date   WBC 5.1 11/13/2018   HGB 13.2 11/13/2018   HCT 38.5 11/13/2018   MCV 91.4 11/13/2018   PLT 281 11/13/2018    Lab Results  Component Value Date   CREATININE 0.91 11/13/2018   BUN 14 11/13/2018   NA 138 11/13/2018   K 4.4 11/13/2018   CL 103 11/13/2018   CO2 29 11/13/2018    Lab Results  Component Value Date   ALT 24 11/13/2018   AST 17 11/13/2018   ALKPHOS 76 03/28/2012   BILITOT 0.3 11/13/2018    Lab Results  Component Value Date   CHOL 139 11/13/2018   HDL 32 (L) 11/13/2018   LDLCALC 78 11/13/2018   TRIG 202 (H) 11/13/2018   CHOLHDL 4.3 11/13/2018   HIV 1 RNA Quant  Date Value  11/13/2018 7,110 copies/mL (H)  01/15/2018 60 copies/mL (H)  11/28/2017 210,000 Copies/mL (H)   CD4 T Cell Abs (/uL)  Date Value  11/13/2018 180 (L)  01/15/2018 220 (L)  11/28/2017 130 (L)   Lab Results  Component Value Date   HAV NON-REACTIVE 11/28/2017   Lab Results  Component Value Date   HEPBSAG NON-REACTIVE 11/28/2017   HEPBSAB REACTIVE (A) 11/28/2017     ASSESSMENT & PLAN:   Problem List Items Addressed This Visit      Unprioritized   Diabetes mellitus (HCC) (Chronic)    Will give a refill for 1 month of insulin as previously prescribed  with referral to internal medicine/community health and wellness for management. He has blood sugar monitoring equipment at home and able to articulate symptoms of hypoglycemia.  Will check A1C today.  Creatinine normal on recent labs.  Lipids reviewed - LDL within recommended range off statin therapy.  Defer further management to new team.        Relevant Medications   Insulin Glargine (BASAGLAR KWIKPEN) 100 UNIT/ML SOPN   insulin aspart (NOVOLOG FLEXPEN) 100 UNIT/ML injection   AIDS (acquired immune deficiency syndrome) (HCC)    Out of care - reviewed his labs in detail today. He will also meet with Selena BattenKim to determine eligibility for long acting injectable ACTG study. I think he will do well with this. Will check genotype today with integrase to determine elligilibity.  Refill biktarvy and dapsone.  STI screening negative recently.       Relevant Medications   bictegravir-emtricitabine-tenofovir AF (BIKTARVY) 50-200-25 MG TABS tablet   dapsone 100 MG tablet   Hepatitis B core antibody positive    He has immunity via previously cleared infection with positive core and surface antibody.  Last had Hep C Ab screen in 2019.  LFTs normal on recent lab work.        Other Visit Diagnoses    HIV disease (HCC)    -  Primary   Relevant Medications   bictegravir-emtricitabine-tenofovir AF (BIKTARVY) 50-200-25 MG TABS tablet   dapsone 100 MG tablet   Other Relevant Orders   Hemoglobin A1c   Ambulatory referral to Ophthalmology   HIV RNA, RTPCR W/R GT (RTI, PI,INT)     Return in about 2 months (around 02/09/2019).   Rexene AlbertsStephanie Uliana Brinker, MSN, NP-C [facility] Pager: [phone]

## 2018-12-11 NOTE — Assessment & Plan Note (Addendum)
Will give a refill for 1 month of insulin as previously prescribed with referral to internal medicine/community health and wellness for management. He has blood sugar monitoring equipment at home and able to articulate symptoms of hypoglycemia.  Will check A1C today.  Creatinine normal on recent labs.  Lipids reviewed - LDL within recommended range off statin therapy.  Defer further management to new team.

## 2018-12-11 NOTE — Patient Instructions (Addendum)
Please start back taking your Biktarvy once a day every day.   Will refill your insulin and have Claris Che schedule you with either [employer] and Wellness team or Internal Medicine team to manage this for you.   I would like to see you back in 2 months and Kyle Young will work on getting you enrolled in the study for injections.

## 2018-12-12 NOTE — Addendum Note (Signed)
Addended by: Blanchard Kelch on: 12/12/2018 12:41 PM   Modules accepted: Orders

## 2018-12-13 ENCOUNTER — Telehealth: Payer: Self-pay

## 2018-12-13 NOTE — Telephone Encounter (Signed)
[facility] made aware of approved Novolog:   CaseId:[id number];Status:Approved;Review Type:Prior Auth;Coverage Start Date:11/13/2018;Coverage End Date:12/13/2019 Cost will be $150 out of pocket due to the need to renew RW/UMAP. Patient made aware and scheduled Financial appointment.

## 2018-12-17 ENCOUNTER — Ambulatory Visit: Payer: Self-pay

## 2018-12-23 LAB — HEMOGLOBIN A1C
Hgb A1c MFr Bld: 12.4 % of total Hgb — ABNORMAL HIGH (ref <5.7)
Mean Plasma Glucose: 309 (calc)
eAG (mmol/L): 17.1 (calc)

## 2018-12-23 LAB — HIV-1 INTEGRASE GENOTYPE

## 2018-12-23 LAB — HIV-1 GENOTYPE: HIV-1 GENOTYPE: DETECTED — AB

## 2018-12-23 LAB — HIV RNA, RTPCR W/R GT (RTI, PI,INT)
HIV 1 RNA Quant: 8000 copies/mL — ABNORMAL HIGH
HIV-1 RNA Quant, Log: 3.9 Log copies/mL — ABNORMAL HIGH

## 2019-01-02 ENCOUNTER — Encounter: Payer: Self-pay | Admitting: *Deleted

## 2019-01-02 ENCOUNTER — Encounter (INDEPENDENT_AMBULATORY_CARE_PROVIDER_SITE_OTHER): Payer: Self-pay

## 2019-01-02 VITALS — BP 155/85 | HR 96 | Temp 98.1°F | Ht 70.0 in | Wt 196.8 lb

## 2019-01-02 DIAGNOSIS — Z006 Encounter for examination for normal comparison and control in clinical research program: Secondary | ICD-10-CM
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
## 2019-01-03 LAB — URINALYSIS
BILIRUBIN URINE: NEGATIVE
Hgb urine dipstick: NEGATIVE
Leukocytes,Ua: NEGATIVE
Nitrite: NEGATIVE
Specific Gravity, Urine: 1.044 — ABNORMAL HIGH (ref 1.001–1.03)
pH: 5.5 (ref 5.0–8.0)

## 2019-01-04 LAB — HIV ANTIBODY (ROUTINE TESTING W REFLEX): HIV 1&2 Ab, 4th Generation: REACTIVE — AB

## 2019-01-04 LAB — COMPREHENSIVE METABOLIC PANEL
AG Ratio: 1.3 (calc) (ref 1.0–2.5)
ALT: 19 U/L (ref 9–46)
AST: 18 U/L (ref 10–40)
Albumin: 4 g/dL (ref 3.6–5.1)
Alkaline phosphatase (APISO): 117 U/L (ref 36–130)
BILIRUBIN TOTAL: 0.4 mg/dL (ref 0.2–1.2)
BUN: 18 mg/dL (ref 7–25)
CO2: 25 mmol/L (ref 20–32)
Calcium: 10.2 mg/dL (ref 8.6–10.3)
Chloride: 102 mmol/L (ref 98–110)
Creat: 0.95 mg/dL (ref 0.60–1.35)
Globulin: 3.2 g/dL (calc) (ref 1.9–3.7)
Glucose, Bld: 370 mg/dL — ABNORMAL HIGH (ref 65–99)
POTASSIUM: 4 mmol/L (ref 3.5–5.3)
Sodium: 137 mmol/L (ref 135–146)
Total Protein: 7.2 g/dL (ref 6.1–8.1)

## 2019-01-04 LAB — TEST AUTHORIZATION

## 2019-01-04 LAB — HIV-1/2 AB - DIFFERENTIATION
HIV-1 antibody: POSITIVE — AB
HIV-2 Ab: NEGATIVE

## 2019-01-04 LAB — HEPATITIS C ANTIBODY
Hepatitis C Ab: NONREACTIVE
SIGNAL TO CUT-OFF: 0.04 (ref <1.00)

## 2019-01-04 LAB — HEPATITIS B SURFACE ANTIGEN: Hepatitis B Surface Ag: NONREACTIVE

## 2019-01-04 LAB — BILIRUBIN, DIRECT: Bilirubin, Direct: 0.1 mg/dL (ref 0.0–0.2)

## 2019-01-20 ENCOUNTER — Emergency Department (HOSPITAL_COMMUNITY)
Admission: EM | Admit: 2019-01-20 | Discharge: 2019-01-20 | Disposition: A | Discharge status: Home / Self Care | Payer: Managed Care, Other (non HMO) | Attending: Emergency Medicine | Admitting: Emergency Medicine

## 2019-01-20 ENCOUNTER — Emergency Department (HOSPITAL_COMMUNITY): Payer: Managed Care, Other (non HMO)

## 2019-01-20 ENCOUNTER — Encounter (HOSPITAL_COMMUNITY): Payer: Self-pay | Admitting: Emergency Medicine

## 2019-01-20 DIAGNOSIS — Y939 Activity, unspecified: Secondary | ICD-10-CM | POA: Diagnosis not present

## 2019-01-20 DIAGNOSIS — T148XXA Other injury of unspecified body region, initial encounter: Secondary | ICD-10-CM

## 2019-01-20 DIAGNOSIS — S21112A Laceration without foreign body of left front wall of thorax without penetration into thoracic cavity, initial encounter: Secondary | ICD-10-CM | POA: Diagnosis present

## 2019-01-20 DIAGNOSIS — Y929 Unspecified place or not applicable: Secondary | ICD-10-CM | POA: Diagnosis not present

## 2019-01-20 DIAGNOSIS — Y999 Unspecified external cause status: Secondary | ICD-10-CM | POA: Insufficient documentation

## 2019-01-20 LAB — PREPARE FRESH FROZEN PLASMA
Unit division: 0
Unit division: 0

## 2019-01-20 LAB — BPAM RBC
Blood Product Expiration Date: 202004162359
Blood Product Expiration Date: 202004162359
ISSUE DATE / TIME: 202003150621
ISSUE DATE / TIME: 202003150621
Unit Type and Rh: 5100
Unit Type and Rh: 5100

## 2019-01-20 LAB — TYPE AND SCREEN
Unit division: 0
Unit division: 0

## 2019-01-20 LAB — BPAM FFP
BLOOD PRODUCT EXPIRATION DATE: 202003192359
Blood Product Expiration Date: 202003192359
ISSUE DATE / TIME: 202003150621
ISSUE DATE / TIME: 202003150621
Unit Type and Rh: 6200
Unit Type and Rh: 6200

## 2019-01-20 MED ORDER — LIDOCAINE-EPINEPHRINE (PF) 2 %-1:200000 IJ SOLN
20.0000 mL | Freq: Once | INTRAMUSCULAR | Status: AC
  Administered 2019-01-20: 20 mL via INTRADERMAL
  Filled 2019-01-20: qty 20

## 2019-01-20 MED ORDER — FENTANYL CITRATE (PF) 100 MCG/2ML IJ SOLN
50.0000 ug | Freq: Once | INTRAMUSCULAR | Status: AC
  Administered 2019-01-20: 50 ug via INTRAVENOUS
  Filled 2019-01-20: qty 2
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
## 2019-01-20 NOTE — ED Notes (Signed)
Patient verbalized understanding of dc instructions, vss, ambulatory with nad.   

## 2019-01-20 NOTE — ED Notes (Signed)
CSI at bedside.

## 2019-01-20 NOTE — ED Notes (Signed)
Dr. Clayborne Dana at bedside to suture laceration
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
## 2019-01-21 ENCOUNTER — Encounter: Payer: Self-pay | Admitting: Infectious Diseases

## 2019-02-12 ENCOUNTER — Encounter: Payer: Self-pay | Admitting: Infectious Diseases

## 2019-02-12 ENCOUNTER — Ambulatory Visit (INDEPENDENT_AMBULATORY_CARE_PROVIDER_SITE_OTHER): Payer: Managed Care, Other (non HMO) | Admitting: Infectious Diseases

## 2019-02-12 ENCOUNTER — Ambulatory Visit: Payer: Self-pay

## 2019-02-12 ENCOUNTER — Other Ambulatory Visit: Payer: Self-pay

## 2019-02-12 VITALS — BP 123/83 | HR 93 | Wt 195.0 lb

## 2019-02-12 DIAGNOSIS — E119 Type 2 diabetes mellitus without complications: Secondary | ICD-10-CM | POA: Diagnosis not present

## 2019-02-12 DIAGNOSIS — Z794 Long term (current) use of insulin: Secondary | ICD-10-CM | POA: Diagnosis not present

## 2019-02-12 DIAGNOSIS — Z4802 Encounter for removal of sutures: Secondary | ICD-10-CM

## 2019-02-12 DIAGNOSIS — B20 Human immunodeficiency virus [HIV] disease: Secondary | ICD-10-CM

## 2019-02-12 NOTE — Assessment & Plan Note (Signed)
Continue Biktarvy.  We will reassess viral load and CD4 count today.  He seems to have good motivation to continue on his medications presently.  I hope that this is durable.  We will have him come back in 2 months so we can touch base again.  I will communicate my chart results with him as soon as they are available

## 2019-02-12 NOTE — Patient Instructions (Addendum)
You look to be feeling well!   Keep up the good work with taking her Biktarvy every day.  This is been put you in the best case scenario whether facing a pandemic or any other infections that your body may encounter.  At work if you can wear a mask and feel comfortable you can certainly do so.  But it sounds like that is not an option for you.  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  Hands away from your eyes nose face mouth.  Please come back in 2 months so we can check in again.  We will do lab work today for you and I will release results on your MyChart.    We will eventually have to talk about your diabetes.

## 2019-02-12 NOTE — Assessment & Plan Note (Signed)
Sutures are ready to be removed as incision line is well approximated and overgrowth of skin noted.  4 sutures removed completely intact without complications.  He has been instructed to leave this open to air and cleanse with soap and water.
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
## 2019-02-12 NOTE — Assessment & Plan Note (Addendum)
We will continue dapsone 100 mg daily.  If his CD4 count is above 200 today we will continue for 3 more months.  He has no signs of opportunistic infection on exam today.

## 2019-02-12 NOTE — Assessment & Plan Note (Signed)
He has very poorly controlled diabetes.  Once we can get his HIV under better control I will try to get him into care with an endocrinologist or somebody with internal medicine to help him with this need.  I told him today that I worry more about his diabetes at this point long-term that I do was HIV.

## 2019-02-13 LAB — T-HELPER CELL (CD4) - (RCID CLINIC ONLY)
CD4 % Helper T Cell: 11 % — ABNORMAL LOW (ref 33–55)
CD4 T Cell Abs: 230 /uL — ABNORMAL LOW (ref 400–2700)

## 2019-02-16 LAB — HIV-1 RNA QUANT-NO REFLEX-BLD
HIV 1 RNA Quant: 33 copies/mL — ABNORMAL HIGH
HIV-1 RNA Quant, Log: 1.52 Log copies/mL — ABNORMAL HIGH

## 2019-03-22 ENCOUNTER — Telehealth: Payer: Self-pay | Admitting: *Deleted

## 2019-03-22 NOTE — Telephone Encounter (Signed)
Received signed records request asking for patient's chart to be sent to new provider at [facility] in [place], [place]. Placed request at front for Virgina Evener, Zachary George, RN

## 2019-03-22 NOTE — Telephone Encounter (Signed)
Hate to see him leave [place] but glad he is staying in care - he is a nice guy. If you speak with him tell him I wish him well and am so proud of him for taking steps to improve his health.

## 2019-07-02 ENCOUNTER — Other Ambulatory Visit: Payer: Self-pay | Admitting: *Deleted

## 2019-07-02 ENCOUNTER — Other Ambulatory Visit: Payer: Self-pay

## 2019-07-02 ENCOUNTER — Ambulatory Visit: Payer: Self-pay

## 2019-07-02 DIAGNOSIS — B20 Human immunodeficiency virus [HIV] disease: Secondary | ICD-10-CM

## 2019-07-03 LAB — T-HELPER CELL (CD4) - (RCID CLINIC ONLY)
CD4 % Helper T Cell: 8 % — ABNORMAL LOW (ref 33–65)
CD4 T Cell Abs: 165 /uL — ABNORMAL LOW (ref 400–1790)

## 2019-07-05 LAB — COMPLETE METABOLIC PANEL WITH GFR
AG Ratio: 1.2 (calc) (ref 1.0–2.5)
ALT: 33 U/L (ref 9–46)
AST: 35 U/L (ref 10–40)
Albumin: 3.9 g/dL (ref 3.6–5.1)
Alkaline phosphatase (APISO): 96 U/L (ref 36–130)
BUN: 15 mg/dL (ref 7–25)
CO2: 28 mmol/L (ref 20–32)
Calcium: 9.7 mg/dL (ref 8.6–10.3)
Chloride: 103 mmol/L (ref 98–110)
Creat: 0.82 mg/dL (ref 0.60–1.35)
GFR, Est African American: 122 mL/min/{1.73_m2} (ref >=60)
GFR, Est Non African American: 105 mL/min/{1.73_m2} (ref >=60)
Globulin: 3.3 g/dL (calc) (ref 1.9–3.7)
Glucose, Bld: 272 mg/dL — ABNORMAL HIGH (ref 65–99)
Potassium: 4.3 mmol/L (ref 3.5–5.3)
Sodium: 136 mmol/L (ref 135–146)
Total Bilirubin: 0.4 mg/dL (ref 0.2–1.2)
Total Protein: 7.2 g/dL (ref 6.1–8.1)

## 2019-07-05 LAB — CBC WITH DIFFERENTIAL/PLATELET
Absolute Monocytes: 395 cells/uL (ref 200–950)
Basophils Absolute: 19 cells/uL (ref 0–200)
Basophils Relative: 0.5 %
Eosinophils Absolute: 80 cells/uL (ref 15–500)
Eosinophils Relative: 2.1 %
HCT: 38.8 % (ref 38.5–50.0)
Hemoglobin: 13.2 g/dL (ref 13.2–17.1)
Lymphs Abs: 1995 cells/uL (ref 850–3900)
MCH: 31.5 pg (ref 27.0–33.0)
MCHC: 34 g/dL (ref 32.0–36.0)
MCV: 92.6 fL (ref 80.0–100.0)
MPV: 11.2 fL (ref 7.5–12.5)
Monocytes Relative: 10.4 %
Neutro Abs: 1311 cells/uL — ABNORMAL LOW (ref 1500–7800)
Neutrophils Relative %: 34.5 %
Platelets: 199 10*3/uL (ref 140–400)
RBC: 4.19 10*6/uL — ABNORMAL LOW (ref 4.20–5.80)
RDW: 11.9 % (ref 11.0–15.0)
Total Lymphocyte: 52.5 %
WBC: 3.8 10*3/uL (ref 3.8–10.8)

## 2019-07-05 LAB — HIV-1 RNA QUANT-NO REFLEX-BLD
HIV 1 RNA Quant: 92700 copies/mL — ABNORMAL HIGH
HIV-1 RNA Quant, Log: 4.97 Log copies/mL — ABNORMAL HIGH

## 2019-07-23 ENCOUNTER — Other Ambulatory Visit: Payer: Self-pay

## 2019-07-23 ENCOUNTER — Ambulatory Visit (INDEPENDENT_AMBULATORY_CARE_PROVIDER_SITE_OTHER): Payer: Managed Care, Other (non HMO) | Admitting: Infectious Diseases

## 2019-07-23 ENCOUNTER — Encounter: Payer: Self-pay | Admitting: Infectious Diseases

## 2019-07-23 VITALS — BP 157/88 | HR 87

## 2019-07-23 DIAGNOSIS — E083513 Diabetes mellitus due to underlying condition with proliferative diabetic retinopathy with macular edema, bilateral: Secondary | ICD-10-CM

## 2019-07-23 DIAGNOSIS — B20 Human immunodeficiency virus [HIV] disease: Secondary | ICD-10-CM | POA: Diagnosis not present

## 2019-07-23 DIAGNOSIS — Z23 Encounter for immunization: Secondary | ICD-10-CM | POA: Diagnosis not present

## 2019-07-23 MED ORDER — BIKTARVY 50-200-25 MG PO TABS: 1.0000 | ORAL_TABLET | Freq: Every day | ORAL | 5 refills | Status: DC

## 2019-07-23 MED ORDER — DAPSONE 100 MG PO TABS: 100.0000 mg | ORAL_TABLET | Freq: Every day | ORAL | 5 refills | Status: DC

## 2019-07-23 NOTE — Patient Instructions (Signed)
It is always a pleasure to see you!  Let's get you back on your medication - will refill your biktarvy and dapsone for your HIV treatment. The dapsone is temporary while you get your numbers looking better.   [facility] is where I want you to go for your diabetes and preventative care.  We can see their records and communicate easily and they will help you with you diabetes. Please call to make an appointment.  Main Line: [phone] Hours (M-F): 8am - 5pm   We gave you your flu shot today  Please come back in 6 weeks so we can repeat your labs and see how you are. Bring your broom!

## 2019-07-23 NOTE — Progress Notes (Signed)
Name: Kyle Young DOB: 05/01/1971 MRN: [id number] PCP: System, Pcp Not In   Patient Active Problem List   Diagnosis Date Noted  . HIV disease (HCC) 02/12/2019  . Hepatitis B core antibody positive 12/11/2018  . ED (erectile dysfunction) 12/13/2017  . AIDS (acquired immune deficiency syndrome) (HCC) 02/19/2012  . Diabetes mellitus (HCC) 02/19/2012  . Alcohol abuse 02/07/2012  . Substance induced mood disorder (HCC) 02/07/2012    Class: Chronic   Subjective:  Brief Narrative: Kyle Young is a 48 y.o. AA male with HIV disease, diagnosed with HIV in 2007; AIDS status + at the time of diagnosis and intermittently with poor adherence.  HIV Risk: bisexual.  History of OIs: HPV. Was in care in [place] then moved to [place] and was in the care of [facility] system.   Previous Regimen:  Truvada, Norvir + Reyataz   Biktarvy   Genotype:   None per previous provider   HPI/ROS: Kyle Young is back after period of time not in care again.  Kyle Young moved to [place] for a few months where Kyle Young worked a lot however got "off track again".  Kyle Young did not go into much detail but alludes to the fact that it has to do with previous problems Kyle Young has had taking medications.  Kyle Young has not taken his Biktarvy since we saw each other back in April of this year when his viral load was actually under good control at 33 copies.  Kyle Young says Kyle Young has not lost any weight but has maintained his weight.  Kyle Young works 50 to 60 hours a week sometimes more; Kyle Young specifically chooses to do this and delve into the work to help avoid some of the temptations with regards to alcohol and other drug use.  Kyle Young has been clean for the last 3 months since Kyle Young is been back in [place].  Kyle Young would like to get back on his medication and review his blood work in detail so Kyle Young knows "how sick Kyle Young is".   Review of Systems  Constitutional: Negative for chills and fever.  HENT: Negative for sore throat and tinnitus.   Eyes: Negative for blurred  vision and photophobia.  Respiratory: Negative for cough and sputum production.   Cardiovascular: Negative for chest pain.  Gastrointestinal: Negative for diarrhea, nausea and vomiting.  Genitourinary: Negative for dysuria.  Musculoskeletal: Negative for back pain, joint pain and myalgias.  Skin: Negative for rash.  Neurological: Negative for headaches.  Psychiatric/Behavioral: Negative for depression and substance abuse.     Past Medical History:  Diagnosis Date  . Depression   . Diabetes mellitus   . HIV positive (HCC)   . Mental disorder   . MRSA (methicillin resistant Staphylococcus aureus)   . Pneumonia    Outpatient Medications Prior to Visit  Medication Sig Dispense Refill  . citalopram (CELEXA) 10 MG tablet Take 1 tablet (10 mg total) by mouth daily. 30 tablet 0  . dapsone 100 MG tablet Take 1 tablet (100 mg total) by mouth daily. (Patient not taking: Reported on 07/23/2019) 30 tablet 5  . gabapentin (NEURONTIN) 300 MG capsule Take 300 mg by mouth 3 (three) times daily.    Marland Kitchen ibuprofen (ADVIL,MOTRIN) 200 MG tablet Take 800 mg by mouth every 6 (six) hours as needed. Patient used this medication for swelling and pain.    Marland Kitchen insulin aspart (NOVOLOG FLEXPEN) 100 UNIT/ML injection Inject 10-25 Units into the skin 3 (three) times daily before meals. According to sliding scale (Patient not taking:  Reported on 07/23/2019) 10 mL 0  . Insulin Glargine (BASAGLAR KWIKPEN) 100 UNIT/ML SOPN Inject 0.3 mLs (30 Units total) into the skin at bedtime. (Patient not taking: Reported on 07/23/2019) 5 pen 0  . insulin lispro (HUMALOG) 100 UNIT/ML injection 20-30 units pe \\r  sliding scale with meals    . ondansetron (ZOFRAN) 4 MG tablet Take 1 tablet (4 mg total) by mouth every 8 (eight) hours as needed for nausea or vomiting. (Patient not taking: Reported on 07/23/2019) 20 tablet 0  . traZODone (DESYREL) 100 MG tablet Take by mouth.    . bictegravir-emtricitabine-tenofovir AF (BIKTARVY) 50-200-25 MG TABS  tablet Take 1 tablet by mouth daily. (Patient not taking: Reported on 07/23/2019) 30 tablet 5  . dapsone 100 MG tablet Take 1 tablet (100 mg total) by mouth daily. (Patient not taking: Reported on 07/23/2019) 30 tablet 0   No facility-administered medications prior to visit.      Allergies  Allergen Reactions  . Sulfa Antibiotics Itching  . Sulfamethoxazole-Trimethoprim Itching  . Bactrim [Sulfamethoxazole-Trimethoprim] Other (See Comments)  . Bactrim Rash and Other (See Comments)    Fainting     Social History   Tobacco Use  . Smoking status: Former Smoker    Packs/day: 0.50    Types: Cigarettes    Quit date: 09/10/2018    Years since quitting: 0.8  . Smokeless tobacco: Never Used  Substance Use Topics  . Alcohol use: Yes    Comment: rarely  . Drug use: No    Types: Cocaine    Comment: Quit street drugs 16 months ago.    Family History  Problem Relation Age of Onset  . Diabetes Mother   . Hypertension Mother   . Hypertension Father     Social History   Substance and Sexual Activity  Sexual Activity Yes  . Birth control/protection: None    Physical Exam and Objective Findings:  Vitals:   07/23/19 1523  BP: (!) 157/88  Pulse: 87   There is no height or weight on file to calculate BMI.  Physical Exam Constitutional:      Appearance: Kyle Young is well-developed.     Comments: Seated comfortably in chair during visit.   HENT:     Mouth/Throat:     Dentition: Normal dentition. No dental abscesses.  Cardiovascular:     Rate and Rhythm: Normal rate and regular rhythm.     Heart sounds: Normal heart sounds.  Pulmonary:     Effort: Pulmonary effort is normal.     Breath sounds: Normal breath sounds.  Abdominal:     General: There is no distension.     Palpations: Abdomen is soft.     Tenderness: There is no abdominal tenderness.  Lymphadenopathy:     Cervical: No cervical adenopathy.  Skin:    General: Skin is warm and dry.     Findings: No rash.   Neurological:     Mental Status: Kyle Young is alert and oriented to person, place, and time.  Psychiatric:        Judgment: Judgment normal.     Comments: In good spirits today and engaged in care discussion.     Lab Results Lab Results  Component Value Date   WBC 3.8 07/02/2019   HGB 13.2 07/02/2019   HCT 38.8 07/02/2019   MCV 92.6 07/02/2019   PLT 199 07/02/2019    Lab Results  Component Value Date   CREATININE 0.82 07/02/2019   BUN 15 07/02/2019   NA 136 07/02/2019  K 4.3 07/02/2019   CL 103 07/02/2019   CO2 28 07/02/2019    Lab Results  Component Value Date   ALT 33 07/02/2019   AST 35 07/02/2019   ALKPHOS 76 03/28/2012   BILITOT 0.4 07/02/2019    Lab Results  Component Value Date   CHOL 139 11/13/2018   HDL 32 (L) 11/13/2018   LDLCALC 78 11/13/2018   TRIG 202 (H) 11/13/2018   CHOLHDL 4.3 11/13/2018   HIV 1 RNA Quant (copies/mL)  Date Value  07/02/2019 92,700 (H)  02/12/2019 33 (H)  12/11/2018 8,000 (H)   CD4 T Cell Abs (/uL)  Date Value  07/02/2019 165 (L)  02/12/2019 230 (L)  11/13/2018 180 (L)   Lab Results  Component Value Date   HAV NON-REACTIVE 11/28/2017   Lab Results  Component Value Date   HEPBSAG NON-REACTIVE 01/02/2019   HEPBSAB REACTIVE (A) 11/28/2017     ASSESSMENT & PLAN:   Problem List Items Addressed This Visit      Unprioritized   Diabetes mellitus (HCC) (Chronic)    Uncontrolled and not taking his insulin. Kyle Young cannot afford copay for endocrinologist. I gave him the number and contact information to make an appointment with [facility] on [address] to help get him back on diabetes medications.   Lab Results  Component Value Date   HGBA1C 12.4 (H) 12/11/2018         AIDS (acquired immune deficiency syndrome) (HCC) - Primary    No findings suggestive of OI on exam today. Will start Dapsone once daily for PJP proph as Kyle Young is intolerant to bactrim. Reviewed his most recent and historical lab work with him as it pertains to  his HIV. I informed him that Kyle Young has AIDS again with a very high viral load of nearly 100,000 copies.   I am skeptical that Kyle Young will be adherent. Kyle Young now has stable housing and says that this is the biggest thing that impacts his ability to take his medications on a daily basis. I reminded him that life will continue to get in his way if Kyle Young lets it and needs to work out a better plan as these moments come too often for him.      Relevant Medications   dapsone 100 MG tablet   bictegravir-emtricitabine-tenofovir AF (BIKTARVY) 50-200-25 MG TABS tablet   HIV disease (HCC)    Resume Biktarvy once daily and have him back in 6-8 weeks to repeat his viral load for treatment response.  Flu shot given today.  Reminded him to please use condoms with sexual encounters or abstain as Kyle Young is infectious and at risk of spreading HIV to a partner.       Relevant Medications   dapsone 100 MG tablet   bictegravir-emtricitabine-tenofovir AF (BIKTARVY) 50-200-25 MG TABS tablet    Other Visit Diagnoses    Need for immunization against influenza       Relevant Orders   Flu Vaccine QUAD 36+ mos IM (Completed)     Return in about 6 weeks (around 09/03/2019).   I spent 30 minutes with the patient including greater than 80% of time in face to face counsel and discussion of the above findings/plan and trajectory should Kyle Young not change his ability to manage his very manageable conditions.   Rexene AlbertsStephanie Dixon, MSN, NP-C [facility]  [email or web address] Pager: [phone] Office: [phone] [facility] Main Line: [phone]

## 2019-07-24 ENCOUNTER — Encounter: Payer: Self-pay | Admitting: Infectious Diseases

## 2019-07-24 NOTE — Assessment & Plan Note (Signed)
Resume Biktarvy once daily and have him back in 6-8 weeks to repeat his viral load for treatment response.  Flu shot given today.  Reminded him to please use condoms with sexual encounters or abstain as he is infectious and at risk of spreading HIV to a partner.

## 2019-07-24 NOTE — Assessment & Plan Note (Addendum)
No findings suggestive of OI on exam today. Will start Dapsone once daily for PJP proph as he is intolerant to bactrim. Reviewed his most recent and historical lab work with him as it pertains to his HIV. I informed him that he has AIDS again with a very high viral load of nearly 100,000 copies.   I am skeptical that he will be adherent. He now has stable housing and says that this is the biggest thing that impacts his ability to take his medications on a daily basis. I reminded him that life will continue to get in his way if he lets it and needs to work out a better plan as these moments come too often for him.

## 2019-07-24 NOTE — Assessment & Plan Note (Signed)
Uncontrolled and not taking his insulin. He cannot afford copay for endocrinologist. I gave him the number and contact information to make an appointment with [facility] on [address] to help get him back on diabetes medications.   Lab Results  Component Value Date   HGBA1C 12.4 (H) 12/11/2018

## 2019-08-14 ENCOUNTER — Telehealth: Payer: Self-pay | Admitting: *Deleted

## 2019-08-14 NOTE — Telephone Encounter (Signed)
Correct and thank you for your advice - he works in a very [employer] and I don't want him at work until we know more about what's going on. This is too long after the flu shot to be related to that and I worry he indeed has COVID pneumonia.   I would ask that we try to set him up with a video visit with Marya Amsler or one of the other providers this week (today or tomorrow pref since he has shortness of breath) to get a look at him to help determine if he is OK to stay at home to quarantine and self monitor.   He uses his MyChart and should be able to see the results as soon as available. He can also expect more information from me regarding what he should do in the mean time. I am happy to provide him with a note for work if he needs this.

## 2019-08-14 NOTE — Telephone Encounter (Signed)
Thank you. I sent him a MyChart message also asking him the same and giving some general recommendations for him at home.

## 2019-08-14 NOTE — Telephone Encounter (Signed)
Patient called to report that since taking his FLU shot 07/23/19 he has had increasing cough and congestion, with fever. He advised today is having sore throat, shortness of breath and a tightness in his chest he wants to know what to do. I asked him if he was at work because of background noise and patient advised "yes". Advised patient to leave work and go immediatly to [employer] [facility] to be tested for [employer]. He asked if he is negative what should he do. Advised him the same thing if he is positive and treat his symptoms. But the first thing he needs to do is be tested. He stated is will try to find a ride as he does not drive. Advised him to go as soon as possible and that I will let [facility] [place] know what is going on and reminded him he will need to quarantine until his test comes back. He was not happy about that he wants to test and go back to work. Told the patient NO please go home after the test and wait for results.

## 2019-08-14 NOTE — Telephone Encounter (Signed)
Case manager Caryl Pina from [facility] called to report that she is working with the patient and will check in on him to make sure he understands how important it is that he leaves work and gets tested. Advised her trying to get back in touch with him but his phone is disconnected. She advised she will try as well. She advised he does not live alone and she knows his phone is not working at this time.   Caryl Pina called back toreports that patient has left work (early).

## 2019-08-15 ENCOUNTER — Other Ambulatory Visit: Payer: Self-pay

## 2019-08-15 DIAGNOSIS — Z20822 Contact with and (suspected) exposure to covid-19: Secondary | ICD-10-CM

## 2019-08-17 LAB — NOVEL CORONAVIRUS, NAA: SARS-CoV-2, NAA: NOT DETECTED

## 2019-10-16 ENCOUNTER — Encounter: Payer: Self-pay | Admitting: Infectious Diseases

## 2019-10-16 NOTE — Progress Notes (Signed)
Patient ID: Kyle Young, male   DOB: Aug 14, 1971, 47 y.o.   MRN: [id number] Called patient from [facility] left message to call for appointment

## 2019-11-18 ENCOUNTER — Encounter: Payer: Self-pay | Admitting: Infectious Diseases

## 2019-11-18 NOTE — Progress Notes (Signed)
Patient ID: Kyle Young, male   DOB: 1971/04/25, 49 y.o.   MRN: [id number] Working [facility] called Kyle Young left message to call for appointment

## 2019-12-26 ENCOUNTER — Other Ambulatory Visit: Payer: Self-pay

## 2019-12-31 ENCOUNTER — Ambulatory Visit: Payer: Managed Care, Other (non HMO)

## 2019-12-31 ENCOUNTER — Other Ambulatory Visit: Payer: Self-pay

## 2019-12-31 ENCOUNTER — Other Ambulatory Visit: Payer: Managed Care, Other (non HMO)

## 2019-12-31 ENCOUNTER — Other Ambulatory Visit
Admission: RE | Admit: 2019-12-31 | Discharge: 2019-12-31 | Disposition: A | Discharge status: Home / Self Care | Payer: Managed Care, Other (non HMO) | Source: Ambulatory Visit | Attending: Infectious Diseases | Admitting: Infectious Diseases

## 2019-12-31 DIAGNOSIS — B20 Human immunodeficiency virus [HIV] disease: Secondary | ICD-10-CM | POA: Insufficient documentation

## 2019-12-31 DIAGNOSIS — Z113 Encounter for screening for infections with a predominantly sexual mode of transmission: Secondary | ICD-10-CM

## 2019-12-31 DIAGNOSIS — Z20822 Contact with and (suspected) exposure to covid-19: Secondary | ICD-10-CM | POA: Insufficient documentation

## 2019-12-31 DIAGNOSIS — Z79899 Other long term (current) drug therapy: Secondary | ICD-10-CM

## 2020-01-01 LAB — T-HELPER CELL (CD4) - (RCID CLINIC ONLY)
CD4 % Helper T Cell: 7 % — ABNORMAL LOW (ref 33–65)
CD4 T Cell Abs: 119 /uL — ABNORMAL LOW (ref 400–1790)

## 2020-01-01 LAB — NOVEL CORONAVIRUS, NAA: SARS-CoV-2, NAA: NOT DETECTED

## 2020-01-01 LAB — URINE CYTOLOGY ANCILLARY ONLY
Chlamydia: NEGATIVE
Comment: NEGATIVE
Comment: NORMAL
Neisseria Gonorrhea: NEGATIVE

## 2020-01-04 LAB — CBC WITH DIFFERENTIAL/PLATELET
Absolute Monocytes: 301 cells/uL (ref 200–950)
Basophils Absolute: 22 cells/uL (ref 0–200)
Basophils Relative: 0.5 %
Eosinophils Absolute: 39 cells/uL (ref 15–500)
Eosinophils Relative: 0.9 %
HCT: 39.1 % (ref 38.5–50.0)
Hemoglobin: 13.4 g/dL (ref 13.2–17.1)
Lymphs Abs: 1828 cells/uL (ref 850–3900)
MCH: 31.7 pg (ref 27.0–33.0)
MCHC: 34.3 g/dL (ref 32.0–36.0)
MCV: 92.4 fL (ref 80.0–100.0)
MPV: 11.4 fL (ref 7.5–12.5)
Monocytes Relative: 7 %
Neutro Abs: 2111 cells/uL (ref 1500–7800)
Neutrophils Relative %: 49.1 %
Platelets: 172 10*3/uL (ref 140–400)
RBC: 4.23 10*6/uL (ref 4.20–5.80)
RDW: 11.8 % (ref 11.0–15.0)
Total Lymphocyte: 42.5 %
WBC: 4.3 10*3/uL (ref 3.8–10.8)

## 2020-01-04 LAB — COMPREHENSIVE METABOLIC PANEL
AG Ratio: 1.1 (calc) (ref 1.0–2.5)
ALT: 45 U/L (ref 9–46)
AST: 52 U/L — ABNORMAL HIGH (ref 10–40)
Albumin: 3.8 g/dL (ref 3.6–5.1)
Alkaline phosphatase (APISO): 89 U/L (ref 36–130)
BUN: 10 mg/dL (ref 7–25)
CO2: 30 mmol/L (ref 20–32)
Calcium: 9.6 mg/dL (ref 8.6–10.3)
Chloride: 104 mmol/L (ref 98–110)
Creat: 0.88 mg/dL (ref 0.60–1.35)
Globulin: 3.6 g/dL (calc) (ref 1.9–3.7)
Glucose, Bld: 204 mg/dL — ABNORMAL HIGH (ref 65–99)
Potassium: 4 mmol/L (ref 3.5–5.3)
Sodium: 140 mmol/L (ref 135–146)
Total Bilirubin: 0.4 mg/dL (ref 0.2–1.2)
Total Protein: 7.4 g/dL (ref 6.1–8.1)

## 2020-01-04 LAB — RPR: RPR Ser Ql: NONREACTIVE

## 2020-01-04 LAB — LIPID PANEL
Cholesterol: 119 mg/dL (ref <200)
HDL: 36 mg/dL — ABNORMAL LOW (ref >=40)
LDL Cholesterol (Calc): 65 mg/dL (calc)
Non-HDL Cholesterol (Calc): 83 mg/dL (calc) (ref <130)
Total CHOL/HDL Ratio: 3.3 (calc) (ref <5.0)
Triglycerides: 93 mg/dL (ref <150)

## 2020-01-04 LAB — HIV-1 RNA QUANT-NO REFLEX-BLD
HIV 1 RNA Quant: 261000 copies/mL — ABNORMAL HIGH
HIV-1 RNA Quant, Log: 5.42 Log copies/mL — ABNORMAL HIGH

## 2020-01-16 ENCOUNTER — Ambulatory Visit (INDEPENDENT_AMBULATORY_CARE_PROVIDER_SITE_OTHER): Payer: Self-pay | Admitting: Infectious Diseases

## 2020-01-16 ENCOUNTER — Ambulatory Visit: Payer: Self-pay

## 2020-01-16 ENCOUNTER — Encounter: Payer: Self-pay | Admitting: Infectious Diseases

## 2020-01-16 ENCOUNTER — Telehealth: Payer: Self-pay | Admitting: Pharmacy Technician

## 2020-01-16 ENCOUNTER — Other Ambulatory Visit: Payer: Self-pay

## 2020-01-16 DIAGNOSIS — E083513 Diabetes mellitus due to underlying condition with proliferative diabetic retinopathy with macular edema, bilateral: Secondary | ICD-10-CM

## 2020-01-16 DIAGNOSIS — F329 Major depressive disorder, single episode, unspecified: Secondary | ICD-10-CM

## 2020-01-16 DIAGNOSIS — B20 Human immunodeficiency virus [HIV] disease: Secondary | ICD-10-CM

## 2020-01-16 DIAGNOSIS — F419 Anxiety disorder, unspecified: Secondary | ICD-10-CM

## 2020-01-16 DIAGNOSIS — G629 Polyneuropathy, unspecified: Secondary | ICD-10-CM

## 2020-01-16 MED ORDER — DAPSONE 100 MG PO TABS: 100.0000 mg | ORAL_TABLET | Freq: Every day | ORAL | 5 refills | Status: DC

## 2020-01-16 MED ORDER — BIKTARVY 50-200-25 MG PO TABS: 1.0000 | ORAL_TABLET | Freq: Every day | ORAL | 5 refills | Status: DC

## 2020-01-16 MED ORDER — GABAPENTIN 300 MG PO CAPS: 300.0000 mg | ORAL_CAPSULE | Freq: Two times a day (BID) | ORAL | 2 refills | Status: DC

## 2020-01-16 NOTE — Telephone Encounter (Signed)
[facility] Patient Advocate Encounter   I printed the patient's [employer] copay card for Kyle Young from the [employer].   I have spoken with the patient and he will take it with him to [facility] later today.    The billing information is RxBin: [id number] PCN: [id number] Member ID: [id number] Group ID: [id number]  Beulah Gandy, CPhT Specialty Pharmacy Patient [facility] for Infectious Disease Phone: [phone] Fax: [phone] 01/16/2020 3:16 PM

## 2020-01-16 NOTE — Progress Notes (Signed)
Name: Kyle Young DOB: 1971-08-08 MRN: [id number] PCP: System, Pcp Not In   Patient Active Problem List   Diagnosis Date Noted  . Neuropathy 01/26/2020  . Anxiety and depression 01/26/2020  . HIV disease (HCC) 02/12/2019  . Hepatitis B core antibody positive 12/11/2018  . ED (erectile dysfunction) 12/13/2017  . AIDS (acquired immune deficiency syndrome) (HCC) 02/19/2012  . Diabetes mellitus (HCC) 02/19/2012  . Alcohol abuse 02/07/2012  . Substance induced mood disorder (HCC) 02/07/2012    Class: Chronic   Subjective:  Brief Narrative: Kyle Young is a 49 y.o. AA male with HIV disease, diagnosed with HIV in 2007; AIDS status + at the time of diagnosis and intermittently with poor adherence.  HIV Risk: bisexual.  History of OIs: HPV. Was in care in [place] then moved to [place] and was in the care of [facility] system.   Previous Regimen:  Truvada, Norvir + Reyataz   Biktarvy   Genotype:   None per previous provider    CC: Routine HIV care - has been off medications.   HPI/ROS: Has been off all of his medications for all conditions for a while and has been a bit worried about what we would think having him come back.   He has had a growing concern over anxious/depressed mood and finds this to be the true root of the problem with his ability to provide care for himself. He finds that this provides difficulties on a daily basis. Would like to work with counselor and has been talking to [facility] caseworker of late.   He does feel like he has lost some weight and others have told him this. Also concern with stabbing needle-like pains to the bottom of his feet. He does not recall taking any medication for this in the past. Nothing he has tried has made this feel better and he frequently has to stop/slow down while the pain passes.    Review of Systems  Constitutional: Positive for activity change and unexpected weight change. Negative for chills, fatigue and fever.  HENT:  Negative for congestion and trouble swallowing.   Eyes: Negative for photophobia and visual disturbance.  Respiratory: Negative for chest tightness and shortness of breath.   Cardiovascular: Negative for chest pain and leg swelling.  Gastrointestinal: Negative for abdominal pain, constipation and diarrhea.  Endocrine: Positive for polydipsia and polyuria.  Genitourinary: Negative for difficulty urinating and frequency.  Musculoskeletal: Negative for back pain and neck pain.  Skin: Negative for color change and rash.  Allergic/Immunologic: Positive for immunocompromised state.  Neurological: Positive for numbness. Negative for dizziness, weakness and headaches.  Hematological: Negative for adenopathy.  Psychiatric/Behavioral: Positive for decreased concentration, dysphoric mood and sleep disturbance.     Past Medical History:  Diagnosis Date  . Depression   . Diabetes mellitus   . HIV positive (HCC)   . Mental disorder   . MRSA (methicillin resistant Staphylococcus aureus)   . Pneumonia    Outpatient Medications Prior to Visit  Medication Sig Dispense Refill  . bictegravir-emtricitabine-tenofovir AF (BIKTARVY) 50-200-25 MG TABS tablet Take 1 tablet by mouth daily. 30 tablet 5  . dapsone 100 MG tablet Take 1 tablet (100 mg total) by mouth daily. (Patient not taking: Reported on 01/16/2020) 30 tablet 5  . ibuprofen (ADVIL,MOTRIN) 200 MG tablet Take 800 mg by mouth every 6 (six) hours as needed. Patient used this medication for swelling and pain.    Marland Kitchen insulin aspart (NOVOLOG FLEXPEN) 100 UNIT/ML injection Inject 10-25 Units into  the skin 3 (three) times daily before meals. According to sliding scale (Patient not taking: Reported on 07/23/2019) 10 mL 0  . Insulin Glargine (BASAGLAR KWIKPEN) 100 UNIT/ML SOPN Inject 0.3 mLs (30 Units total) into the skin at bedtime. (Patient not taking: Reported on 07/23/2019) 5 pen 0  . insulin lispro (HUMALOG) 100 UNIT/ML injection 20-30 units pe \\r  sliding  scale with meals    . ondansetron (ZOFRAN) 4 MG tablet Take 1 tablet (4 mg total) by mouth every 8 (eight) hours as needed for nausea or vomiting. (Patient not taking: Reported on 07/23/2019) 20 tablet 0  . traZODone (DESYREL) 100 MG tablet Take by mouth.    . citalopram (CELEXA) 10 MG tablet Take 1 tablet (10 mg total) by mouth daily. 30 tablet 0  . dapsone 100 MG tablet Take 1 tablet (100 mg total) by mouth daily. (Patient not taking: Reported on 07/23/2019) 30 tablet 5  . gabapentin (NEURONTIN) 300 MG capsule Take 300 mg by mouth 3 (three) times daily.     No facility-administered medications prior to visit.     Allergies  Allergen Reactions  . Sulfa Antibiotics Itching  . Sulfamethoxazole-Trimethoprim Itching  . Bactrim [Sulfamethoxazole-Trimethoprim] Other (See Comments)  . Bactrim Rash and Other (See Comments)    Fainting     Social History   Tobacco Use  . Smoking status: Current Some Day Smoker    Packs/day: 0.50    Types: Cigarettes    Last attempt to quit: 09/10/2018    Years since quitting: 1.3  . Smokeless tobacco: Never Used  Substance Use Topics  . Alcohol use: Yes    Comment: rarely  . Drug use: No    Types: Cocaine    Comment: Quit street drugs 16 months ago.    Family History  Problem Relation Age of Onset  . Diabetes Mother   . Hypertension Mother   . Hypertension Father     Social History   Substance and Sexual Activity  Sexual Activity Yes  . Birth control/protection: None    Physical Exam and Objective Findings:  Vitals:   01/16/20 1427  BP: (!) 166/93  Pulse: 99  Temp: 97.9 F (36.6 C)  SpO2: 99%  Weight: 196 lb (88.9 kg)  Height: 5' 8.5" (1.74 m)   Body mass index is 29.37 kg/m.  Physical Exam Constitutional:      Appearance: He is well-developed.     Comments: Seated comfortably in chair during visit.   HENT:     Mouth/Throat:     Dentition: Normal dentition. No dental abscesses.  Cardiovascular:     Rate and Rhythm: Normal  rate and regular rhythm.     Heart sounds: Normal heart sounds.  Pulmonary:     Effort: Pulmonary effort is normal.     Breath sounds: Normal breath sounds.  Abdominal:     General: There is no distension.     Palpations: Abdomen is soft.     Tenderness: There is no abdominal tenderness.  Lymphadenopathy:     Cervical: No cervical adenopathy.  Skin:    General: Skin is warm and dry.     Findings: No rash.  Neurological:     Mental Status: He is alert and oriented to person, place, and time.  Psychiatric:        Attention and Perception: Attention normal.        Mood and Affect: Mood normal.        Speech: Speech normal.  Behavior: Behavior normal. Behavior is cooperative.        Judgment: Judgment normal.      Lab Results Lab Results  Component Value Date   WBC 4.3 12/31/2019   HGB 13.4 12/31/2019   HCT 39.1 12/31/2019   MCV 92.4 12/31/2019   PLT 172 12/31/2019    Lab Results  Component Value Date   CREATININE 0.88 12/31/2019   BUN 10 12/31/2019   NA 140 12/31/2019   K 4.0 12/31/2019   CL 104 12/31/2019   CO2 30 12/31/2019    Lab Results  Component Value Date   ALT 45 12/31/2019   AST 52 (H) 12/31/2019   ALKPHOS 76 03/28/2012   BILITOT 0.4 12/31/2019    Lab Results  Component Value Date   CHOL 119 12/31/2019   HDL 36 (L) 12/31/2019   LDLCALC 65 12/31/2019   TRIG 93 12/31/2019   CHOLHDL 3.3 12/31/2019   HIV 1 RNA Quant (copies/mL)  Date Value  12/31/2019 261,000 (H)  07/02/2019 92,700 (H)  02/12/2019 33 (H)   CD4 T Cell Abs (/uL)  Date Value  12/31/2019 119 (L)  07/02/2019 165 (L)  02/12/2019 230 (L)   Lab Results  Component Value Date   HAV NON-REACTIVE 11/28/2017   Lab Results  Component Value Date   HEPBSAG NON-REACTIVE 01/02/2019   HEPBSAB REACTIVE (A) 11/28/2017     ASSESSMENT & PLAN:   Problem List Items Addressed This Visit      Unprioritized   Neuropathy    Likely due to diabetes and uncontrolled, longstanding HIV.  Will start him on gabapentin QHS with instructions to increase to BID / TID as tolerated. Precautions discussed. Advised that may never reverse but the goal would be to dull down the pain and prevent further damage. Will need to start with getting HIV and diabetes under better, durable control.       HIV disease (HCC) (Chronic)    Will have him resume Biktarvy once a day as well as Dapsone for OI prophylaxisis. No history of toxo in the past. No findings concerning for opportunistic infection today. Counseled on importance of adherence but happy he is back in care working forward to identify barriers.  Will have him return in 2 months to follow up on adherence and headspace.         Relevant Medications   bictegravir-emtricitabine-tenofovir AF (BIKTARVY) 50-200-25 MG TABS tablet   Diabetes mellitus (HCC) (Chronic)    Will again try to get him linked to care with the [facility] for care of his HTN and diabetes.       Anxiety and depression    No indications for medications at this time after discussion with Mr. Malina. Will proceed with counseling sessions and ongoing support with [facility] - he has re-engaged with them recently for which I am happy for him.       AIDS (acquired immune deficiency syndrome) (HCC)    CD4 down again - resume Dapsone.  Bactrim allergy.  No findings concerning for OI today.       Relevant Medications   bictegravir-emtricitabine-tenofovir AF (BIKTARVY) 50-200-25 MG TABS tablet     Return in about 2 months (around 03/17/2020).   I spent 30 minutes with the patient including greater than 80% of time in face to face counsel and discussion of the above findings/plan and trajectory should he not change his ability to manage his very manageable conditions.   Rexene Alberts, MSN, NP-C [facility]  for Infectious Disease [facility]  [email or web address] Pager: [phone] Office: [phone] [place]:  [phone]

## 2020-01-16 NOTE — Patient Instructions (Signed)
Nice to see you!  Please call the [facility] and [facility] for a new patient appointment to help with your Diabetes.   I have sent in refills for your Biktarvy and Dapsone - Please resume these each once a day.   For your feet pain - I suspect strongly this is due to neuropathy (nerve damage). This is commonly a side affect experienced by diabetic patients when blood sugars are not under good control. HIV and alcohol can also cause this to be worse.   While I cannot promise getting diabetes and HIV under better control will completely correct this, it will improve it. The gabapentin (nerve pill) I gave you will help dull the pain. Can start taking 1 capsule once a night and add one more in the day to help while at work if it does not make you too sleepy.   Please come back to see me again in 2 months while you continue to work with Marylu Lund.

## 2020-01-26 DIAGNOSIS — G629 Polyneuropathy, unspecified: Secondary | ICD-10-CM | POA: Insufficient documentation

## 2020-01-26 DIAGNOSIS — F32A Depression, unspecified: Secondary | ICD-10-CM | POA: Insufficient documentation

## 2020-01-26 DIAGNOSIS — F329 Major depressive disorder, single episode, unspecified: Secondary | ICD-10-CM | POA: Insufficient documentation

## 2020-01-26 DIAGNOSIS — F419 Anxiety disorder, unspecified: Secondary | ICD-10-CM | POA: Insufficient documentation

## 2020-01-26 NOTE — Assessment & Plan Note (Signed)
Will again try to get him linked to care with the [facility] and [facility] for care of his HTN and diabetes.

## 2020-01-26 NOTE — Assessment & Plan Note (Signed)
CD4 down again - resume Dapsone.  Bactrim allergy.  No findings concerning for OI today.

## 2020-01-26 NOTE — Assessment & Plan Note (Addendum)
Likely due to diabetes and uncontrolled, longstanding HIV. Will start him on gabapentin QHS with instructions to increase to BID / TID as tolerated. Precautions discussed. Advised that may never reverse but the goal would be to dull down the pain and prevent further damage. Will need to start with getting HIV and diabetes under better, durable control.

## 2020-01-26 NOTE — Assessment & Plan Note (Signed)
No indications for medications at this time after discussion with Kyle Young. Will proceed with counseling sessions and ongoing support with [facility] - he has re-engaged with them recently for which I am happy for him.

## 2020-01-26 NOTE — Assessment & Plan Note (Signed)
Will have him resume Biktarvy once a day as well as Dapsone for OI prophylaxisis. No history of toxo in the past. No findings concerning for opportunistic infection today. Counseled on importance of adherence but happy he is back in care working forward to identify barriers.  Will have him return in 2 months to follow up on adherence and headspace.

## 2020-03-17 ENCOUNTER — Ambulatory Visit: Payer: Self-pay | Admitting: Infectious Diseases

## 2020-04-15 ENCOUNTER — Ambulatory Visit: Payer: Managed Care, Other (non HMO) | Admitting: Infectious Diseases

## 2020-05-18 ENCOUNTER — Telehealth: Payer: Self-pay

## 2020-05-18 DIAGNOSIS — E083513 Diabetes mellitus due to underlying condition with proliferative diabetic retinopathy with macular edema, bilateral: Secondary | ICD-10-CM

## 2020-05-18 NOTE — Telephone Encounter (Signed)
Patient's case manager from [facility] called stating patient believes his diabetes is "out of control" and would like a referral to [facility] endocrinology. Per Morrie Sheldon, patient specifically asked to be referred to this office. Office phone: [phone], fax: [phone]  Rosanna Randy, RN

## 2020-05-18 NOTE — Telephone Encounter (Signed)
I agree with him about his poor diabetes control.  His HIV is also out of control.  Can you make sure he has an appointment for me soon   Happy to place the referral for endocrinology

## 2020-05-18 NOTE — Addendum Note (Signed)
Addended by: Blanchard Kelch on: 05/18/2020 03:41 PM   Modules accepted: Orders

## 2020-05-18 NOTE — Telephone Encounter (Signed)
Patient scheduled for follow up 7/28 with provider.   Noemi Ishmael Loyola Mast, RN

## 2020-05-26 ENCOUNTER — Other Ambulatory Visit: Payer: Self-pay

## 2020-05-26 DIAGNOSIS — B20 Human immunodeficiency virus [HIV] disease: Secondary | ICD-10-CM

## 2020-05-28 ENCOUNTER — Other Ambulatory Visit: Payer: Self-pay

## 2020-05-28 ENCOUNTER — Other Ambulatory Visit: Payer: Managed Care, Other (non HMO)

## 2020-05-28 DIAGNOSIS — B20 Human immunodeficiency virus [HIV] disease: Secondary | ICD-10-CM

## 2020-05-29 LAB — T-HELPER CELL (CD4) - (RCID CLINIC ONLY)
CD4 % Helper T Cell: 10 % — ABNORMAL LOW (ref 33–65)
CD4 T Cell Abs: 257 /uL — ABNORMAL LOW (ref 400–1790)

## 2020-06-03 ENCOUNTER — Ambulatory Visit: Payer: Managed Care, Other (non HMO) | Admitting: Infectious Diseases

## 2020-06-04 LAB — COMPLETE METABOLIC PANEL WITH GFR
AG Ratio: 1.3 (calc) (ref 1.0–2.5)
ALT: 14 U/L (ref 9–46)
AST: 15 U/L (ref 10–40)
Albumin: 4 g/dL (ref 3.6–5.1)
Alkaline phosphatase (APISO): 153 U/L — ABNORMAL HIGH (ref 36–130)
BUN: 12 mg/dL (ref 7–25)
CO2: 28 mmol/L (ref 20–32)
Calcium: 9.7 mg/dL (ref 8.6–10.3)
Chloride: 101 mmol/L (ref 98–110)
Creat: 0.91 mg/dL (ref 0.60–1.35)
GFR, Est African American: 115 mL/min/{1.73_m2} (ref >=60)
GFR, Est Non African American: 99 mL/min/{1.73_m2} (ref >=60)
Globulin: 3.2 g/dL (calc) (ref 1.9–3.7)
Glucose, Bld: 369 mg/dL — ABNORMAL HIGH (ref 65–99)
Potassium: 4.1 mmol/L (ref 3.5–5.3)
Sodium: 136 mmol/L (ref 135–146)
Total Bilirubin: 0.4 mg/dL (ref 0.2–1.2)
Total Protein: 7.2 g/dL (ref 6.1–8.1)

## 2020-06-04 LAB — CBC WITH DIFFERENTIAL/PLATELET
Absolute Monocytes: 409 cells/uL (ref 200–950)
Basophils Absolute: 28 cells/uL (ref 0–200)
Basophils Relative: 0.5 %
Eosinophils Absolute: 129 cells/uL (ref 15–500)
Eosinophils Relative: 2.3 %
HCT: 37.9 % — ABNORMAL LOW (ref 38.5–50.0)
Hemoglobin: 13.1 g/dL — ABNORMAL LOW (ref 13.2–17.1)
Lymphs Abs: 2386 cells/uL (ref 850–3900)
MCH: 32.6 pg (ref 27.0–33.0)
MCHC: 34.6 g/dL (ref 32.0–36.0)
MCV: 94.3 fL (ref 80.0–100.0)
MPV: 12.1 fL (ref 7.5–12.5)
Monocytes Relative: 7.3 %
Neutro Abs: 2649 cells/uL (ref 1500–7800)
Neutrophils Relative %: 47.3 %
Platelets: 205 10*3/uL (ref 140–400)
RBC: 4.02 10*6/uL — ABNORMAL LOW (ref 4.20–5.80)
RDW: 12 % (ref 11.0–15.0)
Total Lymphocyte: 42.6 %
WBC: 5.6 10*3/uL (ref 3.8–10.8)

## 2020-06-04 LAB — HIV-1 RNA QUANT-NO REFLEX-BLD
HIV 1 RNA Quant: 24 copies/mL — ABNORMAL HIGH
HIV-1 RNA Quant, Log: 1.38 Log copies/mL — ABNORMAL HIGH

## 2020-06-05 ENCOUNTER — Encounter: Payer: Self-pay | Admitting: Endocrinology

## 2020-06-05 ENCOUNTER — Ambulatory Visit (INDEPENDENT_AMBULATORY_CARE_PROVIDER_SITE_OTHER): Payer: Managed Care, Other (non HMO) | Admitting: Endocrinology

## 2020-06-05 ENCOUNTER — Other Ambulatory Visit: Payer: Self-pay

## 2020-06-05 VITALS — BP 142/80 | HR 88 | Ht 68.5 in | Wt 199.6 lb

## 2020-06-05 DIAGNOSIS — E119 Type 2 diabetes mellitus without complications: Secondary | ICD-10-CM | POA: Diagnosis not present

## 2020-06-05 DIAGNOSIS — E083513 Diabetes mellitus due to underlying condition with proliferative diabetic retinopathy with macular edema, bilateral: Secondary | ICD-10-CM

## 2020-06-05 LAB — POCT GLYCOSYLATED HEMOGLOBIN (HGB A1C): Hemoglobin A1C: 13.9 % — AB (ref 4.0–5.6)

## 2020-06-05 MED ORDER — PEN NEEDLES 32G X 4 MM MISC: 1.0000 | Freq: Every day | 3 refills | Status: DC

## 2020-06-05 MED ORDER — ONETOUCH VERIO VI STRP
1.0000 | ORAL_STRIP | Freq: Every day | 12 refills | Status: DC
Start: 2020-06-05 — End: 2021-03-18

## 2020-06-05 MED ORDER — LANTUS SOLOSTAR 100 UNIT/ML ~~LOC~~ SOPN: 20.0000 [IU] | PEN_INJECTOR | SUBCUTANEOUS | 11 refills | Status: DC

## 2020-06-05 NOTE — Patient Instructions (Addendum)
good diet and exercise significantly improve the control of your diabetes.  please let me know if you wish to be referred to a dietician.  high blood sugar is very risky to your health.  you should see an eye doctor and dentist every year.  It is very important to get all recommended vaccinations.  Controlling your blood pressure and cholesterol drastically reduces the damage diabetes does to your body.  Those who smoke should quit.  Please discuss these with your doctor.  check your blood sugar 4 times a day.  vary the time of day when you check, between before the 3 meals, and at bedtime.  also check if you have symptoms of your blood sugar being too high or too low.  please keep a record of the readings and bring it to your next appointment here (or you can bring the meter itself).  You can write it on any piece of paper.  please call us sooner if your blood sugar goes below 70, or if you have a lot of readings over 200. We will need to take this complex situation in stages.   I have sent a prescription to your pharmacy, to start Lantus, 20 units each morning.   Here is a new meter.  I have sent a prescription to your pharmacy, for strips. Please come back for a follow-up appointment in 1 month.

## 2020-06-05 NOTE — Progress Notes (Signed)
Subjective:    Patient ID: Kyle Young, male    DOB: [birth year], 49 y.o.   MRN: [id number]  HPI pt is referred by Rexene Alberts, NP, for diabetes.  Pt states DM was dx'ed in ; he is unaware of any chronic complicated by PN, foot ulcer, and DR; he took insulin 1995-2020; pt says his diet and exercise are poor; he has never had pancreatitis, pancreatic surgery, severe hypoglycemia or DKA.  He did not tolerate metformin (GI sxs).  Pt says he stopped insulin 1 year ago, due to cost.  He says cbg's are in the 300's.  He requests continuous glucose monitor, as he does not like to check cbg.   Past Medical History:  Diagnosis Date  . Depression   . Diabetes mellitus   . HIV positive (HCC)   . Mental disorder   . MRSA (methicillin resistant Staphylococcus aureus)   . Pneumonia     Past Surgical History:  Procedure Laterality Date  . MRSA surgery      Social History   Socioeconomic History  . Marital status: Married    Spouse name: Not on file  . Number of children: Not on file  . Years of education: Not on file  . Highest education level: Not on file  Occupational History  . Not on file  Tobacco Use  . Smoking status: Current Some Day Smoker    Packs/day: 0.50    Types: Cigarettes    Last attempt to quit: 09/10/2018    Years since quitting: 1.7  . Smokeless tobacco: Never Used  Substance and Sexual Activity  . Alcohol use: Yes    Comment: rarely  . Drug use: No    Types: Cocaine    Comment: Quit street drugs 16 months ago.  Marland Kitchen Sexual activity: Yes    Birth control/protection: None  Other Topics Concern  . Not on file  Social History Narrative   ** Merged History Encounter **       Social Determinants of Health   Financial Resource Strain:   . Difficulty of Paying Living Expenses:   Food Insecurity:   . Worried About Programme researcher, broadcasting/film/video in the Last Year:   . Barista in the Last Year:   Transportation Needs:   . Freight forwarder (Medical):   Marland Kitchen Lack  of Transportation (Non-Medical):   Physical Activity:   . Days of Exercise per Week:   . Minutes of Exercise per Session:   Stress:   . Feeling of Stress :   Social Connections:   . Frequency of Communication with Friends and Family:   . Frequency of Social Gatherings with Friends and Family:   . Attends Religious Services:   . Active Member of Clubs or Organizations:   . Attends Banker Meetings:   Marland Kitchen Marital Status:   Intimate Partner Violence:   . Fear of Current or Ex-Partner:   . Emotionally Abused:   Marland Kitchen Physically Abused:   . Sexually Abused:     Current Outpatient Medications on File Prior to Visit  Medication Sig Dispense Refill  . bictegravir-emtricitabine-tenofovir AF (BIKTARVY) 50-200-25 MG TABS tablet Take 1 tablet by mouth daily. 30 tablet 5  . ibuprofen (ADVIL,MOTRIN) 200 MG tablet Take 800 mg by mouth every 6 (six) hours as needed. Patient used this medication for swelling and pain.     No current facility-administered medications on file prior to visit.    Allergies  Allergen Reactions  .  Sulfa Antibiotics Itching  . Sulfamethoxazole-Trimethoprim Itching  . Bactrim [Sulfamethoxazole-Trimethoprim] Other (See Comments)  . Bactrim Rash and Other (See Comments)    Fainting     Family History  Problem Relation Age of Onset  . Diabetes Mother   . Hypertension Mother   . Hypertension Father     BP (!) 142/80   Pulse 88   Ht 5' 8.5" (1.74 m)   Wt 199 lb 9.6 oz (90.5 kg)   SpO2 95%   BMI 29.91 kg/m   Review of Systems denies weight loss, blurry vision, chest pain, sob, n/v, urinary frequency, memory loss, and depression.  He has lost 20 lbs x 1 year).       Objective:   Physical Exam VS: see vs page GEN: no distress HEAD: head: no deformity eyes: no periorbital swelling, no proptosis external nose and ears are normal.   NECK: supple, thyroid is not enlarged CHEST WALL: no deformity LUNGS: clear to auscultation CV: reg rate and  rhythm, no murmur MUSCULOSKELETAL: muscle bulk and strength are grossly normal.  no obvious joint swelling.  gait is normal and steady EXTEMITIES: no deformity.  Healed ulcer at the left foot.  feet are of normal color and temp.  no leg edema.  PULSES: dorsalis pedis intact bilat.  no carotid bruit NEURO:  cn 2-12 grossly intact.   readily moves all 4's.  sensation is intact to touch on the feet, but decreased from normal SKIN:  Normal texture and temperature.  No rash or suspicious lesion is visible.   NODES:  None palpable at the neck.   PSYCH: alert, well-oriented.  Does not appear anxious nor depressed.     Lab Results  Component Value Date   HGBA1C 13.9 (A) 06/05/2020    Lab Results  Component Value Date   CREATININE 0.91 05/28/2020   BUN 12 05/28/2020   NA 136 05/28/2020   K 4.1 05/28/2020   CL 101 05/28/2020   CO2 28 05/28/2020   I have reviewed outside records, and summarized: Pt was noted to have elevated A1c, and referred here.  Noncompliance with HIV meds was noted.       Assessment & Plan:  Insulin-requiring type 2 DM, with foot ulcer.  Noncompliance with cbg recording and insulin.   Patient Instructions  good diet and exercise significantly improve the control of your diabetes.  please let me know if you wish to be referred to a dietician.  high blood sugar is very risky to your health.  you should see an eye doctor and dentist every year.  It is very important to get all recommended vaccinations.  Controlling your blood pressure and cholesterol drastically reduces the damage diabetes does to your body.  Those who smoke should quit.  Please discuss these with your doctor.  check your blood sugar 4 times a day.  vary the time of day when you check, between before the 3 meals, and at bedtime.  also check if you have symptoms of your blood sugar being too high or too low.  please keep a record of the readings and bring it to your next appointment here (or you can bring  the meter itself).  You can write it on any piece of paper.  please call us sooner if your blood sugar goes below 70, or if you have a lot of readings over 200. We will need to take this complex situation in stages.   I have sent a prescription to your pharmacy,  to start Lantus, 20 units each morning.   Here is a new meter.  I have sent a prescription to your pharmacy, for strips. Please come back for a follow-up appointment in 1 month.

## 2020-06-11 ENCOUNTER — Encounter: Payer: Self-pay | Admitting: Infectious Diseases

## 2020-06-11 ENCOUNTER — Other Ambulatory Visit: Payer: Self-pay

## 2020-06-11 ENCOUNTER — Ambulatory Visit (INDEPENDENT_AMBULATORY_CARE_PROVIDER_SITE_OTHER): Payer: Managed Care, Other (non HMO) | Admitting: Infectious Diseases

## 2020-06-11 DIAGNOSIS — E083513 Diabetes mellitus due to underlying condition with proliferative diabetic retinopathy with macular edema, bilateral: Secondary | ICD-10-CM | POA: Diagnosis not present

## 2020-06-11 DIAGNOSIS — B20 Human immunodeficiency virus [HIV] disease: Secondary | ICD-10-CM | POA: Diagnosis not present

## 2020-06-11 DIAGNOSIS — Z87898 Personal history of other specified conditions: Secondary | ICD-10-CM | POA: Diagnosis not present

## 2020-06-11 MED ORDER — BIKTARVY 50-200-25 MG PO TABS: 1.0000 | ORAL_TABLET | Freq: Every day | ORAL | 5 refills | Status: DC

## 2020-06-11 NOTE — Patient Instructions (Signed)
So nice to see you today!  Please continue your Biktarvy every single day. Your blood work looks so much better!  Plan on coming back to see me again in 3 months with blood work prior to your visit with me.  Will get you your flu shot again at this visit.

## 2020-06-11 NOTE — Assessment & Plan Note (Addendum)
He made his appointment with endocrinology and making changes to get this under better control. [facility] case worker met with him today re: challenges financially affording his diabetes supplies.

## 2020-06-11 NOTE — Progress Notes (Signed)
Name: Kyle Young DOB: 1971/02/03 MRN: [id number] PCP: System, Pcp Not In   Patient Active Problem List   Diagnosis Date Noted  . Neuropathy 01/26/2020  . Anxiety and depression 01/26/2020  . HIV disease ([place]) 02/12/2019  . Hepatitis B core antibody positive 12/11/2018  . ED (erectile dysfunction) 12/13/2017  . Diabetes mellitus ([place]) 02/19/2012  . History of substance use disorder 02/07/2012  . Substance induced mood disorder ([place]) 02/07/2012    Class: Chronic   Subjective:  Brief Narrative: Kyle Young is a 49 y.o. AA male with HIV disease, diagnosed with HIV in 2007; AIDS status + at the time of diagnosis and intermittently with poor adherence.  HIV Risk: bisexual.  History of OIs: HPV. Was in care in [place] then moved to [place] and was in the care of [facility] system.   Previous Regimen:  Truvada, Norvir + Reyataz   Biktarvy   Genotype:   None per previous provider    Chief Complaint  Patient presents with  . Follow-up    No questions or concerns  Routine HIV care.     HPI/ROS: Kyle Young has been doing great lately. A lot of things that once prevented him from being successful on his medications have improved and he is back on track. Recently VL only 24 copies and his CD4 has improved > 200 cells. He is doing well with Biktarvy and thankful he hasn't had any side effects from it.   He is doing better with his diabetes also. Having some trouble affording strips and wants to talk with [facility] about that as well.   Mood is good, sleep is better. Feeling very good.    Review of Systems  Constitutional: Negative for appetite change, chills, fatigue, fever and unexpected weight change.  Eyes: Negative for visual disturbance.  Respiratory: Negative for cough and shortness of breath.   Cardiovascular: Negative for chest pain and leg swelling.  Gastrointestinal: Negative for abdominal pain, diarrhea and nausea.  Genitourinary: Negative for discharge, dysuria and  genital sores.  Musculoskeletal: Negative for joint swelling.  Skin: Negative for color change and rash.  Neurological: Negative for dizziness and headaches.  Hematological: Negative for adenopathy.  Psychiatric/Behavioral: Negative for sleep disturbance. The patient is not nervous/anxious.      Past Medical History:  Diagnosis Date  . Depression   . Diabetes mellitus   . HIV positive ([place])   . Mental disorder   . MRSA (methicillin resistant Staphylococcus aureus)   . Pneumonia    Outpatient Medications Prior to Visit  Medication Sig Dispense Refill  . ibuprofen (ADVIL,MOTRIN) 200 MG tablet Take 800 mg by mouth every 6 (six) hours as needed. Patient used this medication for swelling and pain.    Kyle Young Kitchen insulin glargine (LANTUS SOLOSTAR) 100 UNIT/ML Solostar Pen Inject 20 Units into the skin every morning. And pen needles 1/day. 15 mL 11  . Insulin Pen Needle (PEN NEEDLES) 32G X 4 MM MISC 1 Device by Does not apply route daily. 100 each 3  . bictegravir-emtricitabine-tenofovir AF (BIKTARVY) 50-200-25 MG TABS tablet Take 1 tablet by mouth daily. 30 tablet 5  . glucose blood (ONETOUCH VERIO) test strip 1 each by Other route daily. And lancets 1/day (Patient not taking: Reported on 06/11/2020) 100 each 12   No facility-administered medications prior to visit.     Allergies  Allergen Reactions  . Sulfa Antibiotics Itching  . Sulfamethoxazole-Trimethoprim Itching  . Bactrim [Sulfamethoxazole-Trimethoprim] Other (See Comments)  . Bactrim Rash and Other (See Comments)  Fainting     Social History   Tobacco Use  . Smoking status: Current Some Day Smoker    Packs/day: 0.10    Types: Cigarettes    Last attempt to quit: 09/10/2018    Years since quitting: 1.7  . Smokeless tobacco: Never Used  Substance Use Topics  . Alcohol use: Yes    Comment: rarely  . Drug use: No    Types: Cocaine    Comment: Quit street drugs 16 months ago.    Family History  Problem Relation Age of Onset   . Diabetes Mother   . Hypertension Mother   . Hypertension Father     Social History   Substance and Sexual Activity  Sexual Activity Yes  . Partners: Female  . Birth control/protection: None   Comment: Declined condoms 8/21    Physical Exam and Objective Findings:  Vitals:   06/11/20 1535  Pulse: 87  SpO2: 97%  Weight: 205 lb (93 kg)   Body mass index is 30.72 kg/m.  Physical Exam Constitutional:      Appearance: Normal appearance. He is not ill-appearing.  HENT:     Head: Normocephalic.     Mouth/Throat:     Mouth: Mucous membranes are moist.     Pharynx: Oropharynx is clear.  Eyes:     General: No scleral icterus. Pulmonary:     Effort: Pulmonary effort is normal.  Musculoskeletal:        General: Normal range of motion.     Cervical back: Normal range of motion.  Skin:    Coloration: Skin is not jaundiced or pale.  Neurological:     Mental Status: He is alert and oriented to person, place, and time.  Psychiatric:        Mood and Affect: Mood normal.        Judgment: Judgment normal.      Lab Results Lab Results  Component Value Date   WBC 5.6 05/28/2020   HGB 13.1 (L) 05/28/2020   HCT 37.9 (L) 05/28/2020   MCV 94.3 05/28/2020   PLT 205 05/28/2020    Lab Results  Component Value Date   CREATININE 0.91 05/28/2020   BUN 12 05/28/2020   NA 136 05/28/2020   K 4.1 05/28/2020   CL 101 05/28/2020   CO2 28 05/28/2020    Lab Results  Component Value Date   ALT 14 05/28/2020   AST 15 05/28/2020   ALKPHOS 76 03/28/2012   BILITOT 0.4 05/28/2020    Lab Results  Component Value Date   CHOL 119 12/31/2019   HDL 36 (L) 12/31/2019   LDLCALC 65 12/31/2019   TRIG 93 12/31/2019   CHOLHDL 3.3 12/31/2019   HIV 1 RNA Quant (copies/mL)  Date Value  05/28/2020 24 (H)  12/31/2019 261,000 (H)  07/02/2019 92,700 (H)   CD4 T Cell Abs (/uL)  Date Value  05/28/2020 257 (L)  12/31/2019 119 (L)  07/02/2019 165 (L)   Lab Results  Component Value Date    HAV NON-REACTIVE 11/28/2017   Lab Results  Component Value Date   HEPBSAG NON-REACTIVE 01/02/2019   HEPBSAB REACTIVE (A) 11/28/2017     ASSESSMENT & PLAN:   Problem List Items Addressed This Visit      Unprioritized   HIV disease ([place]) (Chronic)    Kyle Young has been doing better about adherence to [place] recently with suppressed viral load and improved CD4. No longer meets clinical definition for AIDS which is great news.  Will provide  refills for Biktarvy today and have him back in 3 months for routine monitoring with labs prior to his visit.       Relevant Medications   bictegravir-emtricitabine-tenofovir AF (BIKTARVY) 50-200-25 MG TABS tablet   Other Relevant Orders   HIV-1 RNA quant-no reflex-bld   T-helper cell (CD4)- ([facility] clinic only)   History of substance use disorder    Has been in early remission lately with his increased attention to self care and removal of some environmental barriers. I congratulated him today and encouraged him to work on how to be more resilient in the future if something falls out of place so he does not fall back. He agrees and we will continue to discuss.       Diabetes mellitus ([place]) (Chronic)    He made his appointment with endocrinology and making changes to get this under better control. [facility] case worker met with him today re: challenges financially affording his diabetes supplies.         Return in about 3 months (around 09/11/2020).    Janene Madeira, MSN, NP-C [facility] [email or web address] Pager: [phone] Office: [phone] [place]: [phone]

## 2020-06-11 NOTE — Assessment & Plan Note (Signed)
Kyle Young has been doing better about adherence to [facility] recently with suppressed viral load and improved CD4. No longer meets clinical definition for AIDS which is great news.  Will provide refills for Biktarvy today and have him back in 3 months for routine monitoring with labs prior to his visit.

## 2020-06-16 ENCOUNTER — Ambulatory Visit: Payer: Managed Care, Other (non HMO) | Admitting: Endocrinology

## 2020-06-21 NOTE — Assessment & Plan Note (Signed)
Has been in early remission lately with his increased attention to self care and removal of some environmental barriers. I congratulated him today and encouraged him to work on how to be more resilient in the future if something falls out of place so he does not fall back. He agrees and we will continue to discuss.

## 2020-07-10 ENCOUNTER — Ambulatory Visit: Payer: Self-pay | Admitting: Endocrinology

## 2020-09-07 ENCOUNTER — Other Ambulatory Visit: Payer: Managed Care, Other (non HMO)

## 2020-09-22 ENCOUNTER — Encounter: Payer: Managed Care, Other (non HMO) | Admitting: Infectious Diseases

## 2020-09-28 ENCOUNTER — Telehealth: Payer: Self-pay | Admitting: *Deleted

## 2020-09-28 NOTE — Telephone Encounter (Signed)
Patient walked in, said he couldn't fill medication at [facility]. He did not need to meet with Olegario Messier, said he had other coverage (unsure which coverage).  RN contacted [employer]. Biktarvy went through without issue. They will fill it today for him. He last picked up biktarvy 9/2. RN called patient, left voicemail letting him know [facility] would have the refill ready this afternoon. Andree Coss, RN

## 2020-09-29 ENCOUNTER — Other Ambulatory Visit: Payer: Self-pay

## 2020-09-29 NOTE — Telephone Encounter (Signed)
Glad he came here for help. Hopefully he will get back on track wit his medications, including diabetes

## 2020-10-22 ENCOUNTER — Encounter: Payer: Self-pay | Admitting: Infectious Diseases

## 2020-11-21 ENCOUNTER — Other Ambulatory Visit: Payer: Self-pay | Admitting: Infectious Diseases

## 2020-11-21 DIAGNOSIS — B20 Human immunodeficiency virus [HIV] disease: Secondary | ICD-10-CM

## 2020-11-24 ENCOUNTER — Other Ambulatory Visit: Payer: Self-pay

## 2020-11-24 DIAGNOSIS — Z113 Encounter for screening for infections with a predominantly sexual mode of transmission: Secondary | ICD-10-CM

## 2020-11-24 DIAGNOSIS — B20 Human immunodeficiency virus [HIV] disease: Secondary | ICD-10-CM

## 2020-11-24 DIAGNOSIS — Z79899 Other long term (current) drug therapy: Secondary | ICD-10-CM

## 2020-11-24 NOTE — Telephone Encounter (Signed)
Patient advised to make appointment for office visit and labs, patient accepted appointment. Advised to keep appointment in order to receive future refills. Patient verbalized understanding

## 2020-11-27 ENCOUNTER — Other Ambulatory Visit: Payer: Managed Care, Other (non HMO)

## 2020-11-27 ENCOUNTER — Other Ambulatory Visit: Payer: Self-pay | Admitting: Infectious Diseases

## 2020-11-27 DIAGNOSIS — B20 Human immunodeficiency virus [HIV] disease: Secondary | ICD-10-CM

## 2020-12-15 ENCOUNTER — Telehealth: Payer: Self-pay | Admitting: *Deleted

## 2020-12-15 NOTE — Telephone Encounter (Signed)
[facility] engaging with patient, requesting proof of positivity. Faxed. Confirmed upcoming appointment 2/10. Andree Coss, RN

## 2020-12-17 ENCOUNTER — Ambulatory Visit (INDEPENDENT_AMBULATORY_CARE_PROVIDER_SITE_OTHER): Payer: Self-pay | Admitting: Infectious Diseases

## 2020-12-17 ENCOUNTER — Encounter: Payer: Self-pay | Admitting: Infectious Diseases

## 2020-12-17 ENCOUNTER — Ambulatory Visit: Payer: Self-pay

## 2020-12-17 ENCOUNTER — Other Ambulatory Visit: Payer: Self-pay

## 2020-12-17 ENCOUNTER — Telehealth: Payer: Self-pay

## 2020-12-17 VITALS — BP 162/98 | HR 96 | Temp 98.4°F | Wt 195.0 lb

## 2020-12-17 DIAGNOSIS — Z23 Encounter for immunization: Secondary | ICD-10-CM

## 2020-12-17 DIAGNOSIS — B20 Human immunodeficiency virus [HIV] disease: Secondary | ICD-10-CM

## 2020-12-17 MED ORDER — BIKTARVY 50-200-25 MG PO TABS: 1.0000 | ORAL_TABLET | Freq: Every day | ORAL | 5 refills | Status: DC

## 2020-12-17 NOTE — Telephone Encounter (Addendum)
[facility] Patient Advocate Encounter  Completed and sent [employer] Advancing Access application for Biktarvy for this patient who is uninsured.    Patient is approved 12/17/20 through 12/17/21.         Clearance Coots, CPhT Specialty Pharmacy Patient [facility] for [facility]ious [facility] Phone: [phone] Fax:  [phone]

## 2020-12-17 NOTE — Progress Notes (Signed)
Name: Kyle Young DOB: 1971-04-07 MRN: [id number] PCP: Pcp, No    Subjective:  Brief Narrative: Kyle Young is a 50 y.o. AA male with HIV disease, diagnosed with HIV in 2007; AIDS status + at the time of diagnosis and intermittently with poor adherence.  HIV Risk: bisexual.  History of OIs: HPV. Was in care in [place] then moved to [place] and was in the care of [facility] system.   Previous Regimen:  Truvada, Norvir + Reyataz   Biktarvy   Genotype:   None per previous provider    Chief Complaint  Patient presents with  . Follow-up    B20      HPI/ROS: Kyle Young is here to get back on track with Biktarvy. Has had some hard times with housing lately but is in a nice stable place now. This is the biggest barrier to him staying on medications and paying attention to his health, but always knows it is a priority. Has been out of [place] since he lost housing in November - stopped it abruptly. Started taking it again 4 days ago now after he moved into his house.   Reports no complaints today suggestive of associated opportunistic infection or advancing HIV disease such as fevers, night sweats, weight loss, anorexia, cough, SOB, nausea, vomiting, diarrhea, headache, sensory changes, lymphadenopathy or oral thrush.   Working at [employer] still regularly. Denies any problems with alcohol/drugs. Has access to food and transportation. Working with [place] and met with [place] today.   Had moderna booster in November following initial J&J in March.  Has not had flu shot yet.     Review of Systems  Constitutional: Negative for appetite change, chills, fatigue, fever and unexpected weight change.  Eyes: Negative for visual disturbance.  Respiratory: Negative for cough and shortness of breath.   Cardiovascular: Negative for chest pain and leg swelling.  Gastrointestinal: Negative for abdominal pain, diarrhea and nausea.  Genitourinary: Negative for dysuria, genital sores and penile  discharge.  Musculoskeletal: Negative for joint swelling.  Skin: Negative for color change and rash.  Neurological: Negative for dizziness and headaches.  Hematological: Negative for adenopathy.  Psychiatric/Behavioral: Negative for sleep disturbance. The patient is not nervous/anxious.      Past Medical History:  Diagnosis Date  . Depression   . Diabetes mellitus   . HIV positive ([place])   . Mental disorder   . MRSA (methicillin resistant Staphylococcus aureus)   . Pneumonia    Outpatient Medications Prior to Visit  Medication Sig Dispense Refill  . glucose blood (ONETOUCH VERIO) test strip 1 each by Other route daily. And lancets 1/day 100 each 12  . ibuprofen (ADVIL,MOTRIN) 200 MG tablet Take 800 mg by mouth every 6 (six) hours as needed. Patient used this medication for swelling and pain.    Marland Kitchen insulin glargine (LANTUS SOLOSTAR) 100 UNIT/ML Solostar Pen Inject 20 Units into the skin every morning. And pen needles 1/day. 15 mL 11  . Insulin Pen Needle (PEN NEEDLES) 32G X 4 MM MISC 1 Device by Does not apply route daily. 100 each 3  . BIKTARVY 50-200-25 MG TABS tablet TAKE 1 TABLET BY MOUTH DAILY 30 tablet 0   No facility-administered medications prior to visit.     Allergies  Allergen Reactions  . Sulfa Antibiotics Itching  . Sulfamethoxazole-Trimethoprim Itching  . Bactrim [Sulfamethoxazole-Trimethoprim] Other (See Comments)  . Bactrim Rash and Other (See Comments)    Fainting     Social History   Tobacco Use  .  Smoking status: Current Some Day Smoker    Packs/day: 0.10    Types: Cigarettes    Last attempt to quit: 09/10/2018    Years since quitting: 2.2  . Smokeless tobacco: Never Used  Substance Use Topics  . Alcohol use: Yes    Comment: rarely  . Drug use: No    Types: Cocaine    Comment: Quit street drugs 16 months ago.    Social History   Substance and Sexual Activity  Sexual Activity Yes  . Partners: Female  . Birth control/protection: None    Comment: Declined condoms 06/2021    Physical Exam and Objective Findings:  Vitals:   12/17/20 1534  BP: (!) 162/98  Pulse: 96  Temp: 98.4 F (36.9 C)  TempSrc: Oral  Weight: 195 lb (88.5 kg)   Body mass index is 29.22 kg/m.     Physical Exam Constitutional:      Appearance: Normal appearance. He is not ill-appearing.  HENT:     Head: Normocephalic.     Mouth/Throat:     Mouth: Mucous membranes are moist.     Pharynx: Oropharynx is clear.  Eyes:     General: No scleral icterus. Pulmonary:     Effort: Pulmonary effort is normal.  Musculoskeletal:        General: Normal range of motion.     Cervical back: Normal range of motion.  Skin:    Coloration: Skin is not jaundiced or pale.  Neurological:     Mental Status: He is alert and oriented to person, place, and time.  Psychiatric:        Mood and Affect: Mood normal.        Judgment: Judgment normal.      Lab Results Lab Results  Component Value Date   WBC 5.6 05/28/2020   HGB 13.1 (L) 05/28/2020   HCT 37.9 (L) 05/28/2020   MCV 94.3 05/28/2020   PLT 205 05/28/2020    Lab Results  Component Value Date   CREATININE 0.91 05/28/2020   BUN 12 05/28/2020   NA 136 05/28/2020   K 4.1 05/28/2020   CL 101 05/28/2020   CO2 28 05/28/2020    Lab Results  Component Value Date   ALT 14 05/28/2020   AST 15 05/28/2020   ALKPHOS 76 03/28/2012   BILITOT 0.4 05/28/2020    Lab Results  Component Value Date   CHOL 119 12/31/2019   HDL 36 (L) 12/31/2019   LDLCALC 65 12/31/2019   TRIG 93 12/31/2019   CHOLHDL 3.3 12/31/2019   HIV 1 RNA Quant (copies/mL)  Date Value  05/28/2020 24 (H)  12/31/2019 261,000 (H)  07/02/2019 92,700 (H)   CD4 T Cell Abs (/uL)  Date Value  05/28/2020 257 (L)  12/31/2019 119 (L)  07/02/2019 165 (L)   Lab Results  Component Value Date   HAV NON-REACTIVE 11/28/2017   Lab Results  Component Value Date   HEPBSAG NON-REACTIVE 01/02/2019   HEPBSAB REACTIVE (A) 11/28/2017      ASSESSMENT & PLAN:   Problem List Items Addressed This Visit      Unprioritized   HIV disease ([place]) (Chronic)    Here to get back on biktarvy. Will check genosure to ensure no problems with resuming since he has had significant lapses in meds 2-3 times now. Fortunately he stopped abruptly and not intermittent dosing.  No findings concerning for AIDS defining condition. Will check CD4 today and prescribe bactrim if indicated.  Flu shot today. Has had  covid booster.  Return in about 3 months (around 03/16/2021).       Relevant Medications   bictegravir-emtricitabine-tenofovir AF (BIKTARVY) 50-200-25 MG TABS tablet    Other Visit Diagnoses    Human immunodeficiency virus (HIV) disease ([place])    -  Primary   Relevant Medications   bictegravir-emtricitabine-tenofovir AF (BIKTARVY) 50-200-25 MG TABS tablet   Other Relevant Orders   HIV RNA, RTPCR W/R GT (RTI, PI,INT)   T-helper cell (CD4)- ([facility] clinic only)   COMPLETE METABOLIC PANEL WITH GFR   CBC with Differential/Platelet   Need for immunization against influenza       Relevant Orders   Flu Vaccine QUAD 36+ mos IM (Completed)     Return in about 3 months (around 03/16/2021).     Janene Madeira, MSN, NP-C [facility] for Infectious Disease [email or web address] Pager: [phone] Office: [phone] [place]: [phone]

## 2020-12-17 NOTE — Patient Instructions (Signed)
Always happy to see you.   Pick up your Biktarvy and pick up right where you left off. If you need any extra antibiotics when your blood work comes back I will let you know.   We gave you your flu shot today.   Please stop by the lab on your way out.   Come back and see me again in 3 months. We can do labs at the visit same day for you

## 2020-12-17 NOTE — Assessment & Plan Note (Signed)
Here to get back on biktarvy. Will check genosure to ensure no problems with resuming since he has had significant lapses in meds 2-3 times now. Fortunately he stopped abruptly and not intermittent dosing.  No findings concerning for AIDS defining condition. Will check CD4 today and prescribe bactrim if indicated.  Flu shot today. Has had covid booster.  Return in about 3 months (around 03/16/2021).

## 2020-12-18 LAB — T-HELPER CELL (CD4) - (RCID CLINIC ONLY)
CD4 % Helper T Cell: 8 % — ABNORMAL LOW (ref 33–65)
CD4 T Cell Abs: 203 /uL — ABNORMAL LOW (ref 400–1790)

## 2020-12-25 ENCOUNTER — Telehealth: Payer: Self-pay

## 2020-12-25 NOTE — Telephone Encounter (Signed)
None whatsoever. I will send him a message going over his labs this evening.

## 2020-12-25 NOTE — Telephone Encounter (Signed)
Patient called stating he sees a "resistance detected" result on his MyChart. Patient states he did see that his viral load has jumped up, he says the barriers to getting his medication were resolved at his last visit. Advised patient that Rexene Alberts, NP will have to interpret resistance labs. Patient verbalized understanding and has no further questions.   Sandie Ano, RN

## 2020-12-29 LAB — CBC WITH DIFFERENTIAL/PLATELET
Absolute Monocytes: 282 cells/uL (ref 200–950)
Basophils Absolute: 19 cells/uL (ref 0–200)
Basophils Relative: 0.4 %
Eosinophils Absolute: 61 cells/uL (ref 15–500)
Eosinophils Relative: 1.3 %
HCT: 40.2 % (ref 38.5–50.0)
Hemoglobin: 13.7 g/dL (ref 13.2–17.1)
Lymphs Abs: 2468 cells/uL (ref 850–3900)
MCH: 31.3 pg (ref 27.0–33.0)
MCHC: 34.1 g/dL (ref 32.0–36.0)
MCV: 91.8 fL (ref 80.0–100.0)
MPV: 11.1 fL (ref 7.5–12.5)
Monocytes Relative: 6 %
Neutro Abs: 1871 cells/uL (ref 1500–7800)
Neutrophils Relative %: 39.8 %
Platelets: 246 10*3/uL (ref 140–400)
RBC: 4.38 10*6/uL (ref 4.20–5.80)
RDW: 12.2 % (ref 11.0–15.0)
Total Lymphocyte: 52.5 %
WBC: 4.7 10*3/uL (ref 3.8–10.8)

## 2020-12-29 LAB — COMPLETE METABOLIC PANEL WITH GFR
AG Ratio: 1.3 (calc) (ref 1.0–2.5)
ALT: 20 U/L (ref 9–46)
AST: 23 U/L (ref 10–40)
Albumin: 4.2 g/dL (ref 3.6–5.1)
Alkaline phosphatase (APISO): 107 U/L (ref 36–130)
BUN: 16 mg/dL (ref 7–25)
CO2: 31 mmol/L (ref 20–32)
Calcium: 9.8 mg/dL (ref 8.6–10.3)
Chloride: 102 mmol/L (ref 98–110)
Creat: 0.9 mg/dL (ref 0.60–1.35)
GFR, Est African American: 117 mL/min/{1.73_m2} (ref >=60)
GFR, Est Non African American: 101 mL/min/{1.73_m2} (ref >=60)
Globulin: 3.3 g/dL (calc) (ref 1.9–3.7)
Glucose, Bld: 223 mg/dL — ABNORMAL HIGH (ref 65–99)
Potassium: 4.1 mmol/L (ref 3.5–5.3)
Sodium: 137 mmol/L (ref 135–146)
Total Bilirubin: 0.3 mg/dL (ref 0.2–1.2)
Total Protein: 7.5 g/dL (ref 6.1–8.1)

## 2020-12-29 LAB — HIV-1 INTEGRASE GENOTYPE

## 2020-12-29 LAB — HIV RNA, RTPCR W/R GT (RTI, PI,INT)
HIV 1 RNA Quant: 42400 copies/mL — ABNORMAL HIGH
HIV-1 RNA Quant, Log: 4.63 Log copies/mL — ABNORMAL HIGH

## 2020-12-29 LAB — HIV-1 GENOTYPE: HIV-1 Genotype: DETECTED — AB

## 2021-02-08 ENCOUNTER — Telehealth: Payer: Self-pay

## 2021-02-08 NOTE — Telephone Encounter (Signed)
Patient arrived as walk in to [facility]. Reports that [facility] [address] told him that he does not have any refills on his Biktarvy. Per his chart multiple refills were sent in February pending ADAP approval. Patient reports that he has been approved. RN tried to call pharmacy x3 but was not able to reach team. Patient is going to go by pharmacy to let them know about refills on file and will call us if there are any further issues.   Sante Biedermann Loyola Mast, RN

## 2021-02-11 ENCOUNTER — Ambulatory Visit: Payer: Self-pay | Admitting: Infectious Diseases

## 2021-03-09 ENCOUNTER — Other Ambulatory Visit: Payer: Self-pay

## 2021-03-10 ENCOUNTER — Other Ambulatory Visit: Payer: Self-pay

## 2021-03-10 DIAGNOSIS — B20 Human immunodeficiency virus [HIV] disease: Secondary | ICD-10-CM

## 2021-03-13 LAB — HIV-1 RNA QUANT-NO REFLEX-BLD
HIV 1 RNA Quant: 327 Copies/mL — ABNORMAL HIGH
HIV-1 RNA Quant, Log: 2.51 Log cps/mL — ABNORMAL HIGH

## 2021-03-13 LAB — T-HELPER CELLS (CD4) COUNT (NOT AT ARMC)
Absolute CD4: 215 cells/uL — ABNORMAL LOW (ref 490–1740)
CD4 T Helper %: 10 % — ABNORMAL LOW (ref 30–61)
Total lymphocyte count: 2071 cells/uL (ref 850–3900)

## 2021-03-17 ENCOUNTER — Encounter: Payer: Self-pay | Admitting: Infectious Diseases

## 2021-03-18 ENCOUNTER — Ambulatory Visit (INDEPENDENT_AMBULATORY_CARE_PROVIDER_SITE_OTHER): Payer: Self-pay | Admitting: Infectious Diseases

## 2021-03-18 ENCOUNTER — Other Ambulatory Visit: Payer: Self-pay

## 2021-03-18 ENCOUNTER — Encounter: Payer: Self-pay | Admitting: Infectious Diseases

## 2021-03-18 VITALS — BP 167/105 | HR 90 | Resp 16 | Ht 68.5 in | Wt 195.0 lb

## 2021-03-18 DIAGNOSIS — I10 Essential (primary) hypertension: Secondary | ICD-10-CM

## 2021-03-18 DIAGNOSIS — B20 Human immunodeficiency virus [HIV] disease: Secondary | ICD-10-CM

## 2021-03-18 DIAGNOSIS — E083513 Diabetes mellitus due to underlying condition with proliferative diabetic retinopathy with macular edema, bilateral: Secondary | ICD-10-CM

## 2021-03-18 MED ORDER — PEN NEEDLES 32G X 4 MM MISC: 1.0000 | Freq: Every day | 3 refills | Status: DC

## 2021-03-18 MED ORDER — LANTUS SOLOSTAR 100 UNIT/ML ~~LOC~~ SOPN: 20.0000 [IU] | PEN_INJECTOR | SUBCUTANEOUS | 2 refills | Status: DC

## 2021-03-18 MED ORDER — ONETOUCH VERIO VI STRP: 1.0000 | ORAL_STRIP | Freq: Every day | 12 refills | Status: DC

## 2021-03-18 NOTE — Progress Notes (Signed)
Name: Kyle Young DOB: 11-30-1970 MRN: [id number] PCP: Pcp, No    Subjective:  Brief Narrative: Kyle Young is a 50 y.o. AA male with HIV disease, diagnosed with HIV in 2007; AIDS status + at the time of diagnosis and intermittently with poor adherence.  HIV Risk: bisexual.  History of OIs: HPV. Was in care in [place] then moved to [place] and was in the care of [facility] system.   Previous Regimen:  Truvada, Norvir + Reyataz   Biktarvy   Genotype:   None per previous provider    Chief Complaint  Patient presents with  . 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 Joined [employer] work. Housing is still stable and he enjoys his apartment.  Continues his biktarvy pretty regularly. Some missed doses.  He is currently taking old lantus prescription when he feels poorly and likely with very high blood sugars. He does not have a PCP and has no insurance to see Dr. Loanne Young (whom he saw in July 2021). He does not check his blood sugars currently.  Concerned about his blood pressures being too high.   Reports no complaints today suggestive of associated opportunistic infection or advancing HIV disease such as fevers, night sweats, weight loss, anorexia, cough, SOB, nausea, vomiting, diarrhea, headache, sensory changes, lymphadenopathy or oral thrush.   Working at [employer]. Denies any problems with alcohol/drugs. Has access to food and transportation. Working with Kyle Young and met with Kyle Young today.    Review of Systems  Constitutional: Negative for appetite change, chills, fatigue, fever and unexpected weight change.  Eyes: Negative for visual disturbance.  Respiratory: Negative for cough and shortness of breath.   Cardiovascular: Negative for chest pain and leg swelling.  Gastrointestinal: Negative for abdominal pain, diarrhea and nausea.  Genitourinary: Negative for dysuria, genital sores and penile  discharge.  Musculoskeletal: Negative for joint swelling.  Skin: Negative for color change and rash.  Neurological: Negative for dizziness and headaches.  Hematological: Negative for adenopathy.  Psychiatric/Behavioral: Negative for sleep disturbance. The patient is not nervous/anxious.      Past Medical History:  Diagnosis Date  . Depression   . Diabetes mellitus   . HIV positive ([place])   . Mental disorder   . MRSA (methicillin resistant Staphylococcus aureus)   . Pneumonia    Outpatient Medications Prior to Visit  Medication Sig Dispense Refill  . bictegravir-emtricitabine-tenofovir AF (BIKTARVY) 50-200-25 MG TABS tablet Take 1 tablet by mouth daily. 30 tablet 5  . ibuprofen (ADVIL,MOTRIN) 200 MG tablet Take 800 mg by mouth every 6 (six) hours as needed. Patient used this medication for swelling and pain.    Marland Kitchen glucose blood (ONETOUCH VERIO) test strip 1 each by Other route daily. And lancets 1/day 100 each 12  . insulin glargine (LANTUS SOLOSTAR) 100 UNIT/ML Solostar Pen Inject 20 Units into the skin every morning. And pen needles 1/day. 15 mL 11  . Insulin Pen Needle (PEN NEEDLES) 32G X 4 MM MISC 1 Device by Does not apply route daily. 100 each 3   No facility-administered medications prior to visit.     Allergies  Allergen Reactions  . Sulfa Antibiotics Itching  . Sulfamethoxazole-Trimethoprim Itching  . Bactrim [Sulfamethoxazole-Trimethoprim] Other (See Comments)  . Bactrim Rash and Other (See Comments)    Fainting     Social History   Tobacco Use  . Smoking status: Current Some Day Smoker  Packs/day: 0.10    Types: Cigarettes    Last attempt to quit: 09/10/2018    Years since quitting: 2.5  . Smokeless tobacco: Never Used  Substance Use Topics  . Alcohol use: Yes    Comment: rarely  . Drug use: No    Types: Cocaine    Comment: Quit street drugs 16 months ago.    Social History   Substance and Sexual Activity  Sexual Activity Yes  . Partners: Female   . Birth control/protection: None   Comment: Declined condoms 06/2021    Physical Exam and Objective Findings:  Vitals:   03/18/21 1621 03/18/21 1622  BP: (!) 172/109 (!) 167/105  Pulse: 90   Resp: 16   SpO2: 96%   Weight: 195 lb (88.5 kg)   Height: 5' 8.5" (1.74 m)    Body mass index is 29.22 kg/m.     Physical Exam Constitutional:      Appearance: Normal appearance. He is not ill-appearing.  HENT:     Head: Normocephalic.     Mouth/Throat:     Mouth: Mucous membranes are moist.     Pharynx: Oropharynx is clear.  Eyes:     General: No scleral icterus. Pulmonary:     Effort: Pulmonary effort is normal.  Musculoskeletal:        General: Normal range of motion.     Cervical back: Normal range of motion.  Skin:    Coloration: Skin is not jaundiced or pale.  Neurological:     Mental Status: He is alert and oriented to person, place, and time.  Psychiatric:        Mood and Affect: Mood normal.        Judgment: Judgment normal.      Lab Results Lab Results  Component Value Date   WBC 4.7 12/17/2020   HGB 13.7 12/17/2020   HCT 40.2 12/17/2020   MCV 91.8 12/17/2020   PLT 246 12/17/2020    Lab Results  Component Value Date   CREATININE 0.90 12/17/2020   BUN 16 12/17/2020   NA 137 12/17/2020   K 4.1 12/17/2020   CL 102 12/17/2020   CO2 31 12/17/2020    Lab Results  Component Value Date   ALT 20 12/17/2020   AST 23 12/17/2020   ALKPHOS 76 03/28/2012   BILITOT 0.3 12/17/2020    Lab Results  Component Value Date   CHOL 119 12/31/2019   HDL 36 (L) 12/31/2019   LDLCALC 65 12/31/2019   TRIG 93 12/31/2019   CHOLHDL 3.3 12/31/2019    HIV 1 RNA Quant  Date Value  03/10/2021 327 Copies/mL (H)  12/17/2020 42,400 copies/mL (H)  05/28/2020 24 copies/mL (H)   CD4 T Cell Abs (/uL)  Date Value  12/17/2020 203 (L)  05/28/2020 257 (L)  12/31/2019 119 (L)    Lab Results  Component Value Date   HAV NON-REACTIVE 11/28/2017   Lab Results  Component  Value Date   HEPBSAG NON-REACTIVE 01/02/2019   HEPBSAB REACTIVE (A) 11/28/2017     ASSESSMENT & PLAN:   Problem List Items Addressed This Visit      Unprioritized   Hypertension    BP Readings from Last 3 Encounters:  03/18/21 (!) 167/105  12/17/20 (!) 162/98  06/05/20 (!) 142/80   BPs escalating since 2021. Given diabetes will get him started on ACE inhibitor and have him back in 1 month to check CMP.       Relevant Medications   lisinopril (ZESTRIL)  10 MG tablet   Other Relevant Orders   Comprehensive metabolic panel   HIV disease (HCC) (Chronic)    Viral load reduced from 42,400 >> 327 copies after 3 months of biktarvy. Discussed improving adherence to ensure he can be adequately treated in the future. He will work on this and follow up again in 3 months.  We also need to talk about pneumonia vaccination as he has not yet received these.   Return in about 3 months (around 06/18/2021).       Diabetes mellitus ([place]) (Chronic)    Reviewed Dr. Cordelia Pen note from last summer. A1C 13.9%. Will send in refills for previous dose of lantus and monitoring supplies.  He needs to establish with PCP to help monitor diabetes and get the resources he needs for safe and effective treatment - we again discussed resources. He will call around to see what he can afford and first available appointments.   ADDENDUM: unable to afford lantus as it is not on formulary for [employer]. Levamir is -will reach out to Dr. Loanne Young to see if he can help with recommendations to transition to levamir while we wait for him to get into PCP. Will start metformin and escalate dose to 1g BID. I am sure he will need other agents. Will need hypertension management as well.       Relevant Medications   insulin glargine (LANTUS SOLOSTAR) 100 UNIT/ML Solostar Pen   glucose blood (ONETOUCH VERIO) test strip   Insulin Pen Needle (PEN NEEDLES) 32G X 4 MM MISC   lisinopril (ZESTRIL) 10 MG tablet    Other Visit Diagnoses     Human immunodeficiency virus (HIV) disease ([place])    -  Primary   Relevant Orders   T-helper cell (CD4)- ([facility] clinic only)   HIV-1 RNA quant-no reflex-bld        Janene Madeira, MSN, NP-C [facility] [email or web address] Pager: [phone] Office: [phone] [place]: [phone]

## 2021-03-18 NOTE — Patient Instructions (Signed)
Always wonderful to see you  Come back to see me in late July to repeat your blood work so we can talk about cabenuva at your next visit in 3 months   I 100% love that you are enjoying your job and found a great thing.    Please shop around for a new primary care provider - the internal medicine clinic is taking new patients and will get you in quickly to see what needs to be done for the diabetes and blood pressure.

## 2021-03-30 ENCOUNTER — Telehealth: Payer: Self-pay | Admitting: Infectious Diseases

## 2021-03-30 DIAGNOSIS — E083513 Diabetes mellitus due to underlying condition with proliferative diabetic retinopathy with macular edema, bilateral: Secondary | ICD-10-CM

## 2021-03-30 MED ORDER — METFORMIN HCL 1000 MG PO TABS
1000.0000 mg | ORAL_TABLET | Freq: Two times a day (BID) | ORAL | 3 refills | Status: DC
Start: 2021-04-29 — End: 2022-06-28

## 2021-03-30 MED ORDER — METFORMIN HCL 1000 MG PO TABS: ORAL_TABLET | ORAL | 0 refills | Status: AC

## 2021-03-30 NOTE — Telephone Encounter (Signed)
D/W Colin Mulders from [facility] - Fredrich is not able to afford his insulin as it is not covered under [employer]  Will reach out to Dr. Everardo All for help with levemir conversion since that is covered.   I will send in metformin for him as well to use; unable to use glipizide d/t sulfa allergy.   Lab Results  Component Value Date   HGBA1C 13.9 (A) 06/05/2020    I have asked him again to please establish with primary care team for best resources on treating his diabetes.   Rexene Alberts, MSN, NP-C [facility]  [email or web address] Pager: [phone] Office: [phone] [facility] Main Line: [phone]

## 2021-03-30 NOTE — Telephone Encounter (Signed)
He should buy NPH at [facility]

## 2021-03-31 NOTE — Telephone Encounter (Signed)
LVM for pt with the information that Everardo All suggests that he gets from [facility] called [facility].

## 2021-04-01 DIAGNOSIS — I10 Essential (primary) hypertension: Secondary | ICD-10-CM | POA: Insufficient documentation

## 2021-04-01 MED ORDER — LISINOPRIL 10 MG PO TABS: 10.0000 mg | ORAL_TABLET | Freq: Every day | ORAL | 2 refills | Status: DC

## 2021-04-01 NOTE — Assessment & Plan Note (Signed)
Viral load reduced from 42,400 >> 327 copies after 3 months of biktarvy. Discussed improving adherence to ensure he can be adequately treated in the future. He will work on this and follow up again in 3 months.  We also need to talk about pneumonia vaccination as he has not yet received these.   Return in about 3 months (around 06/18/2021).

## 2021-04-01 NOTE — Assessment & Plan Note (Signed)
Reviewed Dr. George Hugh note from last summer. A1C 13.9%. Will send in refills for previous dose of lantus and monitoring supplies.  He needs to establish with PCP to help monitor diabetes and get the resources he needs for safe and effective treatment - we again discussed resources. He will call around to see what he can afford and first available appointments.   ADDENDUM: unable to afford lantus as it is not on formulary for [employer]. Levamir is -will reach out to Dr. Everardo All to see if he can help with recommendations to transition to levamir while we wait for him to get into PCP. Will start metformin and escalate dose to 1g BID. I am sure he will need other agents. Will need hypertension management as well.

## 2021-04-01 NOTE — Assessment & Plan Note (Signed)
BP Readings from Last 3 Encounters:  03/18/21 (!) 167/105  12/17/20 (!) 162/98  06/05/20 (!) 142/80   BPs escalating since 2021. Given diabetes will get him started on ACE inhibitor and have him back in 1 month to check CMP.

## 2021-04-20 ENCOUNTER — Other Ambulatory Visit: Payer: Self-pay | Admitting: Infectious Diseases

## 2021-04-20 DIAGNOSIS — B20 Human immunodeficiency virus [HIV] disease: Secondary | ICD-10-CM

## 2021-05-05 ENCOUNTER — Other Ambulatory Visit: Payer: Self-pay | Admitting: Family

## 2021-05-05 DIAGNOSIS — B20 Human immunodeficiency virus [HIV] disease: Secondary | ICD-10-CM

## 2021-05-20 ENCOUNTER — Other Ambulatory Visit: Payer: Self-pay

## 2021-06-03 ENCOUNTER — Ambulatory Visit: Payer: Self-pay | Admitting: Infectious Diseases

## 2021-06-15 ENCOUNTER — Other Ambulatory Visit: Payer: Self-pay

## 2021-06-15 DIAGNOSIS — I10 Essential (primary) hypertension: Secondary | ICD-10-CM

## 2021-06-15 DIAGNOSIS — B20 Human immunodeficiency virus [HIV] disease: Secondary | ICD-10-CM

## 2021-06-16 LAB — T-HELPER CELL (CD4) - (RCID CLINIC ONLY)
CD4 % Helper T Cell: 10 % — ABNORMAL LOW (ref 33–65)
CD4 T Cell Abs: 190 /uL — ABNORMAL LOW (ref 400–1790)

## 2021-06-17 LAB — COMPREHENSIVE METABOLIC PANEL
AG Ratio: 1.2 (calc) (ref 1.0–2.5)
ALT: 15 U/L (ref 9–46)
AST: 17 U/L (ref 10–40)
Albumin: 3.9 g/dL (ref 3.6–5.1)
Alkaline phosphatase (APISO): 96 U/L (ref 36–130)
BUN: 8 mg/dL (ref 7–25)
CO2: 27 mmol/L (ref 20–32)
Calcium: 9.6 mg/dL (ref 8.6–10.3)
Chloride: 103 mmol/L (ref 98–110)
Creat: 0.83 mg/dL (ref 0.60–1.29)
Globulin: 3.2 g/dL (calc) (ref 1.9–3.7)
Glucose, Bld: 167 mg/dL — ABNORMAL HIGH (ref 65–99)
Potassium: 4.3 mmol/L (ref 3.5–5.3)
Sodium: 137 mmol/L (ref 135–146)
Total Bilirubin: 0.5 mg/dL (ref 0.2–1.2)
Total Protein: 7.1 g/dL (ref 6.1–8.1)

## 2021-06-17 LAB — HIV-1 RNA QUANT-NO REFLEX-BLD
HIV 1 RNA Quant: 4400 Copies/mL — ABNORMAL HIGH
HIV-1 RNA Quant, Log: 3.64 Log cps/mL — ABNORMAL HIGH

## 2021-07-06 ENCOUNTER — Encounter: Payer: Self-pay | Admitting: Infectious Diseases

## 2021-07-06 ENCOUNTER — Other Ambulatory Visit: Payer: Self-pay

## 2021-07-06 ENCOUNTER — Ambulatory Visit (INDEPENDENT_AMBULATORY_CARE_PROVIDER_SITE_OTHER): Payer: Self-pay | Admitting: Infectious Diseases

## 2021-07-06 VITALS — BP 152/84 | HR 90 | Temp 98.1°F | Wt 194.2 lb

## 2021-07-06 DIAGNOSIS — N410 Acute prostatitis: Secondary | ICD-10-CM | POA: Insufficient documentation

## 2021-07-06 DIAGNOSIS — B20 Human immunodeficiency virus [HIV] disease: Secondary | ICD-10-CM

## 2021-07-06 MED ORDER — LEVOFLOXACIN 500 MG PO TABS: 500.0000 mg | ORAL_TABLET | Freq: Every day | ORAL | 0 refills | Status: DC

## 2021-07-06 MED ORDER — BIKTARVY 50-200-25 MG PO TABS: 1.0000 | ORAL_TABLET | Freq: Every day | ORAL | 5 refills | Status: DC

## 2021-07-06 NOTE — Patient Instructions (Signed)
I think you have a bacterial infection of your prostate ("prostatitis")   Please give me a small urine sample we can check and stop by the lab on your way out   START an antibiotic called LEVAQUIN once a day. You need to take this for probably 4 weeks to clear this infection - some men may need longer so please come back in a month to check in on how you are doing.   START back your Biktarvy once a day.    Prostatitis  Prostatitis is swelling of the prostate gland, also called the prostate. This gland is about 1.5 inches wide and 1 inch high, and it helps to make a fluid called semen. The prostate is below a man's bladder, in front of the butt (rectum). There are different types of prostatitis. What are the causes? One type of prostatitis is caused by an infection from germs (bacteria). Another type is not caused by germs. It may be caused by: Things having to do with the nervous system. This system includes thebrain, spinal cord, and nerves. An autoimmune response. This happens when the body's disease-fighting system attacks healthy tissue in the body by mistake. Psychological factors. These have to do with how the mind works. The causes of other types of prostatitis are normally not known. What are the signs or symptoms? Symptoms of this condition depend on the type of prostatitis you have. If your condition is caused by germs: You may feel pain or burning when you pee (urinate). You may pee often and all of a sudden. You may have problems starting to pee. You may have trouble emptying your bladder when you pee. You may have fever or chills. You may feel pain in your muscles, joints, low back, or lower belly. If you have other types of prostatitis: You may pee often or all of a sudden. You may have trouble starting to pee. You may have a weak flow when you pee. You may leak pee after using the bathroom. You may have other problems, such as: Abnormal fluid coming from the  penis. Pain in the testicles or penis. Pain between the butt and the testicles. Pain when fluid comes out of the penis during sex. How is this treated? Treatment for this condition depends on the type of prostatitis. Treatment may include: Medicines. These may treat pain or swelling, or they may help relax muscles. Exercises to help you move better or get stronger (physical therapy). Heat therapy. Techniques to help you control some of the ways that your body works. Exercises to help you relax. Antibiotic medicine, if your condition is caused by germs. Warm water baths (sitz baths) to relax muscles. Follow these instructions at home: Medicines Take over-the-counter and prescription medicines only as told by your doctor. If you were prescribed an antibiotic medicine, take it as told by your doctor. Do not stop using the antibiotic even if you start to feel better. Managing pain and swelling  Take sitz baths as told by your doctor. For a sitz bath, sit in warm water that is deep enough to cover your hips and butt. If told, put heat on the painful area. Do this as often as told by your doctor. Use the heat source that your doctor recommends, such as a moist heat pack or a heating pad. Place a towel between your skin and the heat source. Leave the heat on for 20-30 minutes. Take off the heat if your skin turns bright red. This is very important  if you are unable to feel pain, heat, or cold. You may have a greater risk of getting burned.  General instructions Do exercises as told by your doctor, if your doctor prescribed them. Keep all follow-up visits as told by your doctor. This is important. Where to find more information [employer] of Diabetes and Digestive and Kidney Diseases: LowApproval.se Contact a doctor if: Your symptoms get worse. You have a fever. Get help right away if: You have chills. You feel light-headed. You feel like you may faint. You cannot  pee. You have blood or clumps of blood (blood clots) in your pee. Summary Prostatitis is swelling of the prostate gland. There are different types of prostatitis. Treatment depends on the type that you have. Take over-the-counter and prescription medicines only as told by your doctor. Get help right away of you have chills, feel light-headed, or feel like you may faint. Also get help right away if you cannot pee or you have blood or clumps of blood in your pee. This information is not intended to replace advice given to you by your health care provider. Make sure you discuss any questions you have with your healthcare provider. Document Revised: 11/29/2019 Document Reviewed: 11/29/2019 Elsevier Patient Education  2022 [employer].

## 2021-07-06 NOTE — Progress Notes (Signed)
Name: Kyle Young DOB: 1971/03/24 MRN: [id number] PCP: Pcp, No    Subjective:  Brief Narrative: Tanner is a 50 y.o. AA male with HIV disease, diagnosed with HIV in 2007; AIDS status + at the time of diagnosis and intermittently with poor adherence.  HIV Risk: bisexual.  History of OIs: HPV. Was in care in [place] then moved to [place] and was in the care of [facility] system.   Previous Regimen: Truvada, Norvir + Reyataz  Biktarvy   Genotype:  None per previous provider    Chief Complaint  Patient presents with   Follow-up    B20 - pt reports difficulty urinating and pain in penis radiating to anus x 8-9 days.      HPI/ROS: New problem of dysuria, pain radiating from penis to rectum. No sexual partners in 79-5 months (male and male partners, only insertive never receptive with males).  When his symptoms started he first noticed that he had a hard time passing urine easily and had a lot of dribbling.  By day 3 of symptoms he really had to squeeze urine out and the pain was pretty significant.  He is going to the bathroom greater than 12 times a day now, day and night and never feels like he fully empties. He is taking 800 mg of Motrin twice a day to help manage the pain which does help.  He is asking if he can drink alcohol or if that makes this worse.    He has been off of his Biktarvy for close to 2 months now.  Housing has stabilized for him but he has had a hard time with increased hours at work making this a priority for himself.  He wants to know how bad his numbers are today.     Review of Systems  Constitutional:  Negative for appetite change, chills, fatigue, fever and unexpected weight change.  Eyes:  Negative for visual disturbance.  Respiratory:  Negative for cough and shortness of breath.   Cardiovascular:  Negative for chest pain and leg swelling.  Gastrointestinal:  Negative for abdominal pain, diarrhea and nausea.  Genitourinary:  Positive for  decreased urine volume, difficulty urinating, dysuria, frequency and urgency. Negative for genital sores, hematuria, penile discharge and penile swelling.  Musculoskeletal:  Negative for joint swelling.  Skin:  Negative for color change and rash.  Neurological:  Negative for dizziness and headaches.  Hematological:  Negative for adenopathy.  Psychiatric/Behavioral:  Negative for sleep disturbance. The patient is not nervous/anxious.     Past Medical History:  Diagnosis Date   Depression    Diabetes mellitus    HIV positive (HCC)    Mental disorder    MRSA (methicillin resistant Staphylococcus aureus)    Pneumonia    Outpatient Medications Prior to Visit  Medication Sig Dispense Refill   glucose blood (ONETOUCH VERIO) test strip 1 each by Other route daily. And lancets 1/day 100 each 12   ibuprofen (ADVIL,MOTRIN) 200 MG tablet Take 800 mg by mouth every 6 (six) hours as needed. Patient used this medication for swelling and pain.     insulin glargine (LANTUS SOLOSTAR) 100 UNIT/ML Solostar Pen Inject 20 Units into the skin every morning. And pen needles 1/day. 15 mL 2   Insulin Pen Needle (PEN NEEDLES) 32G X 4 MM MISC 1 Device by Does not apply route daily. 100 each 3   lisinopril (ZESTRIL) 10 MG tablet Take 1 tablet (10 mg total) by mouth daily. 30 tablet 2  metFORMIN (GLUCOPHAGE) 1000 MG tablet Take 1 tablet (1,000 mg total) by mouth 2 (two) times daily with a meal. 60 tablet 3   bictegravir-emtricitabine-tenofovir AF (BIKTARVY) 50-200-25 MG TABS tablet Take 1 tablet by mouth daily. 30 tablet 5   metFORMIN (GLUCOPHAGE) 1000 MG tablet Take 1 tablet (1,000 mg total) by mouth daily with breakfast for 7 days, THEN 1 tablet (1,000 mg total) 2 (two) times daily with a meal for 23 days. 53 tablet 0   No facility-administered medications prior to visit.     Allergies  Allergen Reactions   Sulfa Antibiotics Itching   Sulfamethoxazole-Trimethoprim Itching   Bactrim  [Sulfamethoxazole-Trimethoprim] Other (See Comments)   Bactrim Rash and Other (See Comments)    Fainting     Social History   Tobacco Use   Smoking status: Some Days    Packs/day: 0.10    Types: Cigarettes    Last attempt to quit: 09/10/2018    Years since quitting: 2.8   Smokeless tobacco: Never  Substance Use Topics   Alcohol use: Yes    Comment: rarely   Drug use: No    Types: Cocaine    Comment: Quit street drugs 16 months ago.    Social History   Substance and Sexual Activity  Sexual Activity Yes   Partners: Female   Birth control/protection: None   Comment: Declined condoms 06/2021    Physical Exam and Objective Findings:  Vitals:   07/06/21 0928  BP: (!) 152/84  Pulse: 90  Temp: 98.1 F (36.7 C)  TempSrc: Oral  SpO2: 97%  Weight: 194 lb 3.2 oz (88.1 kg)   Body mass index is 29.1 kg/m.     Physical Exam Constitutional:      Appearance: Normal appearance. He is not ill-appearing.  HENT:     Head: Normocephalic.     Mouth/Throat:     Mouth: Mucous membranes are moist.     Pharynx: Oropharynx is clear.  Eyes:     General: No scleral icterus. Pulmonary:     Effort: Pulmonary effort is normal.  Genitourinary:    Comments: Reports severe pain and fullness to perineum.  Musculoskeletal:        General: Normal range of motion.     Cervical back: Normal range of motion.  Skin:    Coloration: Skin is not jaundiced or pale.  Neurological:     Mental Status: He is alert and oriented to person, place, and time.  Psychiatric:        Mood and Affect: Mood normal.        Judgment: Judgment normal.     Lab Results Lab Results  Component Value Date   WBC 4.7 12/17/2020   HGB 13.7 12/17/2020   HCT 40.2 12/17/2020   MCV 91.8 12/17/2020   PLT 246 12/17/2020    Lab Results  Component Value Date   CREATININE 0.83 06/15/2021   BUN 8 06/15/2021   NA 137 06/15/2021   K 4.3 06/15/2021   CL 103 06/15/2021   CO2 27 06/15/2021    Lab Results   Component Value Date   ALT 15 06/15/2021   AST 17 06/15/2021   ALKPHOS 76 03/28/2012   BILITOT 0.5 06/15/2021    Lab Results  Component Value Date   CHOL 119 12/31/2019   HDL 36 (L) 12/31/2019   LDLCALC 65 12/31/2019   TRIG 93 12/31/2019   CHOLHDL 3.3 12/31/2019    HIV 1 RNA Quant  Date Value  06/15/2021 4,400 Copies/mL (  H)  03/10/2021 327 Copies/mL (H)  12/17/2020 42,400 copies/mL (H)   CD4 T Cell Abs (/uL)  Date Value  06/15/2021 190 (L)  12/17/2020 203 (L)  05/28/2020 257 (L)    Lab Results  Component Value Date   HAV NON-REACTIVE 11/28/2017   Lab Results  Component Value Date   HEPBSAG NON-REACTIVE 01/02/2019   HEPBSAB REACTIVE (A) 11/28/2017     ASSESSMENT & PLAN:   Problem List Items Addressed This Visit       Unprioritized   HIV disease (HCC) (Chronic)    Off his medication now for nearly 2 months.  We talked about his most recent test results.  I did send in a refill for him in hopes that he will resume the [place] once daily.  We will follow-up again at next office visit in a month.  We did discuss also that having uncontrolled HIV and uncontrolled diabetes puts him in a pretty high risk for recurrent infections and he may struggle with this going forward until his conditions are resolved.  Understand that he has a lot going on with work but also encouraged him to find a time to make this a priority for his long-term health, wellbeing and ability to work and support himself.  We will have him meet with Colin Mulders for [facility] today, he completed his ADAP re-enrollment 2 weeks ago.      Relevant Medications   bictegravir-emtricitabine-tenofovir AF (BIKTARVY) 50-200-25 MG TABS tablet   Acute prostatitis - Primary    Symptoms concerning for acute prostatitis with perineal fullness, dysuria, increased urgency/frequency and decreased urine stream.  Fortunately he is able to pass urine still and not too distended today.  No systemic signs of illness and able to  tolerate PO.  We discussed parameters that would warrant ER evaluation for urgent catheterization to drain bladder.  He has a good understanding of the symptoms and when to report to the ER.  I will collect a urine culture, urine gonorrhea/chlamydia cytology and serum PSA today.  From what he says he also had some urinary dribbling and decreased stream prior to the onset of this current episode.  We talked about referral to [facility] residency clinic for further evaluation.  Levaquin 500 mg once daily x4 weeks with reassessment.  He can continue the Motrin 600 mg twice daily with food for pain management for now.  I am hopeful he will have improvement over the next 48 hours.  I asked him to please send me a MyChart message or call with an update on his symptoms at the end of this week. I encouraged him to hold off alcohol as this is more irritating to bladder and certainly won't help his symptoms.       Relevant Medications   levofloxacin (LEVAQUIN) 500 MG tablet   Other Relevant Orders   Urine cytology ancillary only   Urine Culture   PSA     Rexene Alberts, MSN, NP-C [facility]  [email or web address] Pager: [phone] Office: [phone] [facility] Main Line: [phone]

## 2021-07-06 NOTE — Assessment & Plan Note (Addendum)
Symptoms concerning for acute prostatitis with perineal fullness, dysuria, increased urgency/frequency and decreased urine stream.  Fortunately he is able to pass urine still and not too distended today.  No systemic signs of illness and able to tolerate PO.  We discussed parameters that would warrant ER evaluation for urgent catheterization to drain bladder.  He has a good understanding of the symptoms and when to report to the ER.  I will collect a urine culture, urine gonorrhea/chlamydia cytology and serum PSA today.  From what he says he also had some urinary dribbling and decreased stream prior to the onset of this current episode.  We talked about [facility] to [facility] for further evaluation.  Levaquin 500 mg once daily x4 weeks with reassessment.  He can continue the Motrin 600 mg twice daily with food for pain management for now.  I am hopeful he will have improvement over the next 48 hours.  I asked him to please send me a MyChart message or call with an update on his symptoms at the end of this week. I encouraged him to hold off alcohol as this is more irritating to bladder and certainly won't help his symptoms.

## 2021-07-06 NOTE — Assessment & Plan Note (Signed)
Off his medication now for nearly 2 months.  We talked about his most recent test results.  I did send in a refill for him in hopes that he will resume the [place] once daily.  We will follow-up again at next office visit in a month.  We did discuss also that having uncontrolled HIV and uncontrolled diabetes puts him in a pretty high risk for recurrent infections and he may struggle with this going forward until his conditions are resolved.  Understand that he has a lot going on with work but also encouraged him to find a time to make this a priority for his long-term health, wellbeing and ability to work and support himself.  We will have him meet with Colin Mulders for [facility] today, he completed his ADAP re-enrollment 2 weeks ago.

## 2021-07-07 LAB — PSA: PSA: 15.26 ng/mL — ABNORMAL HIGH (ref <=4.00)

## 2021-07-07 LAB — URINE CYTOLOGY ANCILLARY ONLY
Chlamydia: NEGATIVE
Comment: NEGATIVE
Comment: NEGATIVE
Comment: NORMAL
Neisseria Gonorrhea: NEGATIVE
Trichomonas: NEGATIVE

## 2021-07-09 LAB — URINE CULTURE
MICRO NUMBER:: 12310138
SPECIMEN QUALITY:: ADEQUATE

## 2021-07-14 ENCOUNTER — Encounter: Payer: Self-pay | Admitting: Infectious Diseases

## 2021-08-02 NOTE — Progress Notes (Deleted)
Name: Kyle Young DOB: 1970/12/06 MRN: [id number] PCP: Pcp, No    Subjective:  Brief Narrative: Kyle Young is a 50 y.o. AA male with HIV disease, diagnosed with HIV in 2007; AIDS status + at the time of diagnosis and intermittently with poor adherence.  HIV Risk: bisexual.  History of OIs: HPV. Was in care in [place] then moved to [place] and was in the care of [facility] system.   Previous Regimen: Truvada, Norvir + Reyataz  Biktarvy   Genotype:  None per previous provider    No chief complaint on file.    HPI/ROS: FU for HIV disease and recently treated acute prostatitis. Urine culture grew heavy growth of staphylococcus aureus, interestingly; has been taking levaquin x 4 weeks. PSA elevated at 15.2.   ***   Review of Systems   Past Medical History:  Diagnosis Date   Depression    Diabetes mellitus    HIV positive (HCC)    Mental disorder    MRSA (methicillin resistant Staphylococcus aureus)    Pneumonia    Outpatient Medications Prior to Visit  Medication Sig Dispense Refill   bictegravir-emtricitabine-tenofovir AF (BIKTARVY) 50-200-25 MG TABS tablet Take 1 tablet by mouth daily. 30 tablet 5   glucose blood (ONETOUCH VERIO) test strip 1 each by Other route daily. And lancets 1/day 100 each 12   ibuprofen (ADVIL,MOTRIN) 200 MG tablet Take 800 mg by mouth every 6 (six) hours as needed. Patient used this medication for swelling and pain.     insulin glargine (LANTUS SOLOSTAR) 100 UNIT/ML Solostar Pen Inject 20 Units into the skin every morning. And pen needles 1/day. 15 mL 2   Insulin Pen Needle (PEN NEEDLES) 32G X 4 MM MISC 1 Device by Does not apply route daily. 100 each 3   levofloxacin (LEVAQUIN) 500 MG tablet Take 1 tablet (500 mg total) by mouth daily. 28 tablet 0   lisinopril (ZESTRIL) 10 MG tablet Take 1 tablet (10 mg total) by mouth daily. 30 tablet 2   metFORMIN (GLUCOPHAGE) 1000 MG tablet Take 1 tablet (1,000 mg total) by mouth daily with breakfast  for 7 days, THEN 1 tablet (1,000 mg total) 2 (two) times daily with a meal for 23 days. 53 tablet 0   metFORMIN (GLUCOPHAGE) 1000 MG tablet Take 1 tablet (1,000 mg total) by mouth 2 (two) times daily with a meal. 60 tablet 3   No facility-administered medications prior to visit.     Allergies  Allergen Reactions   Sulfa Antibiotics Itching   Sulfamethoxazole-Trimethoprim Itching   Bactrim [Sulfamethoxazole-Trimethoprim] Other (See Comments)   Bactrim Rash and Other (See Comments)    Fainting     Social History   Tobacco Use   Smoking status: Some Days    Packs/day: 0.10    Types: Cigarettes    Last attempt to quit: 09/10/2018    Years since quitting: 2.8   Smokeless tobacco: Never  Substance Use Topics   Alcohol use: Yes    Comment: rarely   Drug use: No    Types: Cocaine    Comment: Quit street drugs 16 months ago.    Social History   Substance and Sexual Activity  Sexual Activity Yes   Partners: Female   Birth control/protection: None   Comment: Declined condoms 06/2021    Physical Exam and Objective Findings:  There were no vitals filed for this visit.  There is no height or weight on file to calculate BMI.     Physical Exam  Lab Results Lab Results  Component Value Date   WBC 4.7 12/17/2020   HGB 13.7 12/17/2020   HCT 40.2 12/17/2020   MCV 91.8 12/17/2020   PLT 246 12/17/2020    Lab Results  Component Value Date   CREATININE 0.83 06/15/2021   BUN 8 06/15/2021   NA 137 06/15/2021   K 4.3 06/15/2021   CL 103 06/15/2021   CO2 27 06/15/2021    Lab Results  Component Value Date   ALT 15 06/15/2021   AST 17 06/15/2021   ALKPHOS 76 03/28/2012   BILITOT 0.5 06/15/2021    Lab Results  Component Value Date   CHOL 119 12/31/2019   HDL 36 (L) 12/31/2019   LDLCALC 65 12/31/2019   TRIG 93 12/31/2019   CHOLHDL 3.3 12/31/2019    HIV 1 RNA Quant  Date Value  06/15/2021 4,400 Copies/mL (H)  03/10/2021 327 Copies/mL (H)  12/17/2020 42,400  copies/mL (H)   CD4 T Cell Abs (/uL)  Date Value  06/15/2021 190 (L)  12/17/2020 203 (L)  05/28/2020 257 (L)    Lab Results  Component Value Date   HAV NON-REACTIVE 11/28/2017   Lab Results  Component Value Date   HEPBSAG NON-REACTIVE 01/02/2019   HEPBSAB REACTIVE (A) 11/28/2017     ASSESSMENT & PLAN:   Problem List Items Addressed This Visit   None   Rexene Alberts, MSN, NP-C [facility]  [email or web address] Pager: [phone] Office: [phone] [facility] Main Line: [phone]

## 2021-08-03 ENCOUNTER — Ambulatory Visit: Payer: Self-pay | Admitting: Infectious Diseases

## 2021-08-03 DIAGNOSIS — B20 Human immunodeficiency virus [HIV] disease: Secondary | ICD-10-CM

## 2021-08-03 DIAGNOSIS — N410 Acute prostatitis: Secondary | ICD-10-CM

## 2021-11-19 ENCOUNTER — Other Ambulatory Visit: Payer: Self-pay

## 2021-11-19 DIAGNOSIS — Z113 Encounter for screening for infections with a predominantly sexual mode of transmission: Secondary | ICD-10-CM

## 2021-11-19 DIAGNOSIS — B20 Human immunodeficiency virus [HIV] disease: Secondary | ICD-10-CM

## 2021-11-19 DIAGNOSIS — Z79899 Other long term (current) drug therapy: Secondary | ICD-10-CM

## 2021-11-23 ENCOUNTER — Other Ambulatory Visit: Payer: Self-pay

## 2021-12-13 ENCOUNTER — Encounter: Payer: Self-pay | Admitting: Infectious Diseases

## 2022-01-19 ENCOUNTER — Other Ambulatory Visit: Payer: Self-pay

## 2022-02-23 ENCOUNTER — Other Ambulatory Visit: Payer: Self-pay

## 2022-03-15 ENCOUNTER — Other Ambulatory Visit: Payer: Self-pay

## 2022-03-15 ENCOUNTER — Encounter: Payer: Self-pay | Admitting: Infectious Diseases

## 2022-03-15 ENCOUNTER — Ambulatory Visit (INDEPENDENT_AMBULATORY_CARE_PROVIDER_SITE_OTHER): Payer: Self-pay | Admitting: Infectious Diseases

## 2022-03-15 VITALS — BP 114/76 | HR 100 | Temp 98.2°F | Wt 191.0 lb

## 2022-03-15 DIAGNOSIS — E083513 Diabetes mellitus due to underlying condition with proliferative diabetic retinopathy with macular edema, bilateral: Secondary | ICD-10-CM

## 2022-03-15 DIAGNOSIS — B20 Human immunodeficiency virus [HIV] disease: Secondary | ICD-10-CM

## 2022-03-15 DIAGNOSIS — F32A Depression, unspecified: Secondary | ICD-10-CM

## 2022-03-15 DIAGNOSIS — F419 Anxiety disorder, unspecified: Secondary | ICD-10-CM

## 2022-03-15 MED ORDER — BIKTARVY 50-200-25 MG PO TABS: 1.0000 | ORAL_TABLET | Freq: Every day | ORAL | 5 refills | Status: DC

## 2022-03-15 MED ORDER — DAPSONE 100 MG PO TABS: 100.0000 mg | ORAL_TABLET | Freq: Every day | ORAL | 5 refills | Status: DC

## 2022-03-15 NOTE — Progress Notes (Signed)
? ?Name: Kyle Young ?DOB: 14-Jul-1971 ?MRN: [id number] ?PCP: Pcp, No ? ? ? ?Subjective:  ?Brief Narrative: ?Kyle Young is a 51 y.o. 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  ?HIV Risk: bisexual.  ?History of OIs: HPV. Was in care in [place] then moved to [place] and was in the care of [facility] system.  ? ?Previous Regimen: ?Truvada, Norvir + Reyataz  ?Biktarvy  ? ?Genotype:  ? ? ? ?Chief Complaint  ?Patient presents with  ? Follow-up  ?  4 months off medication.   ? ? ? ?HPI/ROS: ?Kyle Young has been off biktarvy for 4 months now.  ?Has continued working up until about 5-6 days ago - concerned about future employment. Spent time discussing. ?He feels like mood has been worsening and becoming more of a barrier. This has been waxing and waning. Today he feels like he is motivated to work on this and prioritize it.  ? ?New rash that is very itchy on the lower legs. No loss of skin or open draining spots. Flat but slightly raised macules on legs, buttocks and upper back.  ?No chance it could be related to sti.  ?Does not feel like it is related to bug bites. Has switched soaps recently but not sure it was that. Worries it is from untreated HIV. He has also noticed decreased appetite, weight loss. 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  ? ? ?Review of Systems  ?Constitutional:  Positive for appetite change and unexpected weight change. Negative for fever.  ?Cardiovascular: Negative.   ?Gastrointestinal: Negative.   ?Genitourinary: Negative.   ?Musculoskeletal: Negative.   ?Skin:  Positive for rash.  ? ? ?Past Medical History:  ?Diagnosis Date  ? Depression   ? Diabetes mellitus   ? HIV positive (HCC)   ? Mental disorder   ? MRSA (methicillin resistant Staphylococcus aureus)   ? Pneumonia   ? ?Outpatient Medications Prior to Visit  ?Medication Sig Dispense Refill  ? glucose blood  (ONETOUCH VERIO) test strip 1 each by Other route daily. And lancets 1/day 100 each 12  ? ibuprofen (ADVIL,MOTRIN) 200 MG tablet Take 800 mg by mouth every 6 (six) hours as needed. Patient used this medication for swelling and pain.    ? insulin glargine (LANTUS SOLOSTAR) 100 UNIT/ML Solostar Pen Inject 20 Units into the skin every morning. And pen needles 1/day. 15 mL 2  ? Insulin Pen Needle (PEN NEEDLES) 32G X 4 MM MISC 1 Device by Does not apply route daily. 100 each 3  ? levofloxacin (LEVAQUIN) 500 MG tablet Take 1 tablet (500 mg total) by mouth daily. 28 tablet 0  ? lisinopril (ZESTRIL) 10 MG tablet Take 1 tablet (10 mg total) by mouth daily. 30 tablet 2  ? metFORMIN (GLUCOPHAGE) 1000 MG tablet Take 1 tablet (1,000 mg total) by mouth 2 (two) times daily with a meal. 60 tablet 3  ? bictegravir-emtricitabine-tenofovir AF (BIKTARVY) 50-200-25 MG TABS tablet Take 1 tablet by mouth daily. 30 tablet 5  ? metFORMIN (GLUCOPHAGE) 1000 MG tablet Take 1 tablet (1,000 mg total) by mouth daily with breakfast for 7 days, THEN 1 tablet (1,000 mg total) 2 (two) times daily with a meal for 23 days. 53 tablet 0  ? ?No facility-administered medications prior to visit.  ? ? ? ?Allergies  ?Allergen Reactions  ? Sulfa Antibiotics Itching  ? Sulfamethoxazole-Trimethoprim  Itching  ? Bactrim [Sulfamethoxazole-Trimethoprim] Other (See Comments)  ? Bactrim Rash and Other (See Comments)  ?  Fainting   ? ? ?Social History  ? ?Tobacco Use  ? Smoking status: Some Days  ?  Packs/day: 0.10  ?  Types: Cigarettes  ?  Last attempt to quit: 09/10/2018  ?  Years since quitting: 3.5  ? Smokeless tobacco: Never  ?Substance Use Topics  ? Alcohol use: Yes  ?  Comment: rarely  ? Drug use: No  ?  Types: Cocaine  ?  Comment: Quit street drugs 16 months ago.  ? ? ?Social History  ? ?Substance and Sexual Activity  ?Sexual Activity Yes  ? Partners: Female  ? Birth control/protection: None  ? Comment: Declined condoms 06/2021  ? ? ?Physical Exam and Objective  Findings:  ?Vitals:  ? 03/15/22 1337  ?BP: 114/76  ?Pulse: 100  ?Temp: 98.2 ?F (36.8 ?C)  ?TempSrc: Temporal  ?SpO2: 99%  ?Weight: 191 lb (86.6 kg)  ? ?Body mass index is 28.62 kg/m?.  ? ? ? ?Physical Exam ?Constitutional:   ?   Appearance: Normal appearance. He is not ill-appearing.  ?HENT:  ?   Head: Normocephalic.  ?   Mouth/Throat:  ?   Mouth: Mucous membranes are moist.  ?   Pharynx: Oropharynx is clear.  ?Eyes:  ?   General: No scleral icterus. ?Cardiovascular:  ?   Rate and Rhythm: Normal rate.  ?Pulmonary:  ?   Effort: Pulmonary effort is normal.  ?Genitourinary: ?   Comments: Reports severe pain and fullness to perineum.  ?Musculoskeletal:     ?   General: Normal range of motion.  ?   Cervical back: Normal range of motion.  ?Skin: ?   Coloration: Skin is not jaundiced or pale.  ?   Comments: Scattered slightly raised scaled hyperpigmented macules along upper thighs bilaterally, buttocks. No skin loss.   ?Neurological:  ?   Mental Status: He is alert and oriented to person, place, and time.  ?Psychiatric:     ?   Mood and Affect: Mood normal.     ?   Judgment: Judgment normal.  ? ? ? ?Lab Results ?Lab Results  ?Component Value Date  ? WBC 4.7 12/17/2020  ? HGB 13.7 12/17/2020  ? HCT 40.2 12/17/2020  ? MCV 91.8 12/17/2020  ? PLT 246 12/17/2020  ?  ?Lab Results  ?Component Value Date  ? CREATININE 0.83 06/15/2021  ? BUN 8 06/15/2021  ? NA 137 06/15/2021  ? K 4.3 06/15/2021  ? CL 103 06/15/2021  ? CO2 27 06/15/2021  ?  ?Lab Results  ?Component Value Date  ? ALT 15 06/15/2021  ? AST 17 06/15/2021  ? ALKPHOS 76 03/28/2012  ? BILITOT 0.5 06/15/2021  ?  ?Lab Results  ?Component Value Date  ? CHOL 119 12/31/2019  ? HDL 36 (L) 12/31/2019  ? LDLCALC 65 12/31/2019  ? TRIG 93 12/31/2019  ? CHOLHDL 3.3 12/31/2019  ? ? ?HIV 1 RNA Quant  ?Date Value  ?06/15/2021 4,400 Copies/mL (H)  ?03/10/2021 327 Copies/mL (H)  ?12/17/2020 42,400 copies/mL (H)  ? ?CD4 T Cell Abs (/uL)  ?Date Value  ?06/15/2021 190 (L)  ?12/17/2020 203  (L)  ?05/28/2020 257 (L)  ? ? ?Lab Results  ?Component Value Date  ? HAV NON-REACTIVE 11/28/2017  ? ?Lab Results  ?Component Value Date  ? HEPBSAG NON-REACTIVE 01/02/2019  ? HEPBSAB REACTIVE (A) 11/28/2017  ? ? ? ?ASSESSMENT & PLAN:  ? ?Problem List  Items Addressed This Visit   ? ?  ? Unprioritized  ? Diabetes mellitus (HCC) - Primary (Chronic)  ? Relevant Orders  ? Hemoglobin A1c  ? HIV disease (HCC) (Chronic)  ?  Last CD4 count < 200 in Aug 2022, now off medications for 29m. He has had weight loss and new rashes. Will plan to resume biktarvy and dapsone for OI prophylaxis. VL with genotype to be obtained today, CD4 and RPR.  ?We had a long discussion about adherence patterns. I would hesitate to get him on injectable options given his challenges to get to appointments.  ?Maybe we can talk more about it in the next few appointments.  ?RTC in 6w.  ? ?  ?  ? Relevant Medications  ? bictegravir-emtricitabine-tenofovir AF (BIKTARVY) 50-200-25 MG TABS tablet  ? dapsone 100 MG tablet  ? Other Relevant Orders  ? RPR  ? T-helper cells (CD4) count  ? HIV RNA, RTPCR W/R GT (RTI, PI,INT)  ? COMPLETE METABOLIC PANEL WITH GFR  ? CBC  ? Anxiety and depression  ?  He is open to meeting with Okey Regal - will get him to schedule an appt with her next Tuesday. We discussed that if this is not handled his ability to take care of himself and take prescribed medications is likely to continue to be short term.  ? ?  ?  ?  ? ?Rexene Alberts, MSN, NP-C ?[facility] ?[facility]  ?[email or web address] ?Pager: [phone] ?Office: [phone] ?[facility] Main Line: [phone]  ?

## 2022-03-15 NOTE — Assessment & Plan Note (Addendum)
He is open to meeting with Okey Regal - will get him to schedule an appt with her next Tuesday. We discussed that if this is not handled his ability to take care of himself and take prescribed medications is likely to continue to be short term.  ?

## 2022-03-15 NOTE — Patient Instructions (Addendum)
Prescriptions sent over for Biktarvy and Dapsone - take both once a day together please  ? ?Will check your blood work today  ? ?We were out of the cream for the itching - can get some generic over the counter steroid cream (generic) to put on twice a day ? ?Can use the over the counter anti-itch medication I gave you 1-2 times a day ? ?Make an appointment with Okey Regal  our counselor next week for Tuesday - If we need to rethink this plan let me know. ?

## 2022-03-15 NOTE — Assessment & Plan Note (Signed)
Last CD4 count < 200 in Aug 2022, now off medications for 37m. He has had weight loss and new rashes. Will plan to resume biktarvy and dapsone for OI prophylaxis. VL with genotype to be obtained today, CD4 and RPR.  ?We had a long discussion about adherence patterns. I would hesitate to get him on injectable options given his challenges to get to appointments.  ?Maybe we can talk more about it in the next few appointments.  ?RTC in 6w.  ?

## 2022-03-16 LAB — T-HELPER CELLS (CD4) COUNT (NOT AT ARMC)
CD4 % Helper T Cell: 5 % — ABNORMAL LOW (ref 33–65)
CD4 T Cell Abs: 80 /uL — ABNORMAL LOW (ref 400–1790)

## 2022-03-27 LAB — COMPLETE METABOLIC PANEL WITH GFR
AG Ratio: 0.7 (calc) — ABNORMAL LOW (ref 1.0–2.5)
ALT: 18 U/L (ref 9–46)
AST: 29 U/L (ref 10–35)
Albumin: 3.1 g/dL — ABNORMAL LOW (ref 3.6–5.1)
Alkaline phosphatase (APISO): 103 U/L (ref 35–144)
BUN: 18 mg/dL (ref 7–25)
CO2: 26 mmol/L (ref 20–32)
Calcium: 8.9 mg/dL (ref 8.6–10.3)
Chloride: 102 mmol/L (ref 98–110)
Creat: 1.13 mg/dL (ref 0.70–1.30)
Globulin: 4.3 g/dL (calc) — ABNORMAL HIGH (ref 1.9–3.7)
Glucose, Bld: 306 mg/dL — ABNORMAL HIGH (ref 65–99)
Potassium: 4.1 mmol/L (ref 3.5–5.3)
Sodium: 135 mmol/L (ref 135–146)
Total Bilirubin: 0.3 mg/dL (ref 0.2–1.2)
Total Protein: 7.4 g/dL (ref 6.1–8.1)
eGFR: 79 mL/min/{1.73_m2} (ref >=60)

## 2022-03-27 LAB — CBC
HCT: 44.9 % (ref 38.5–50.0)
Hemoglobin: 14.7 g/dL (ref 13.2–17.1)
MCH: 30.2 pg (ref 27.0–33.0)
MCHC: 32.7 g/dL (ref 32.0–36.0)
MCV: 92.4 fL (ref 80.0–100.0)
MPV: 11.1 fL (ref 7.5–12.5)
Platelets: 247 10*3/uL (ref 140–400)
RBC: 4.86 10*6/uL (ref 4.20–5.80)
RDW: 12.7 % (ref 11.0–15.0)
WBC: 4.9 10*3/uL (ref 3.8–10.8)

## 2022-03-27 LAB — HIV-1 INTEGRASE GENOTYPE

## 2022-03-27 LAB — HIV-1 GENOTYPE: HIV-1 Genotype: DETECTED — AB

## 2022-03-27 LAB — HEMOGLOBIN A1C
Hgb A1c MFr Bld: 10.4 % of total Hgb — ABNORMAL HIGH (ref <5.7)
Mean Plasma Glucose: 252 mg/dL
eAG (mmol/L): 13.9 mmol/L

## 2022-03-27 LAB — RPR: RPR Ser Ql: NONREACTIVE

## 2022-03-27 LAB — HIV RNA, RTPCR W/R GT (RTI, PI,INT)
HIV 1 RNA Quant: 210000 copies/mL — ABNORMAL HIGH
HIV-1 RNA Quant, Log: 5.32 Log copies/mL — ABNORMAL HIGH

## 2022-04-06 ENCOUNTER — Telehealth: Payer: Self-pay

## 2022-04-06 NOTE — Telephone Encounter (Signed)
Patient agreeable to come in tomorrow at 3:30 pm.   Tomi Bamberger, CMA

## 2022-04-06 NOTE — Telephone Encounter (Signed)
Patient called stating that he has had fever (highest temp was 102), burning cough, and dizziness x 4 days. Patient hasn't tested for Covid. Patient hasn't checked his blood sugar levels lately either. Please advise.    Erionna Strum Lesli Albee, CMA

## 2022-04-07 ENCOUNTER — Ambulatory Visit: Payer: Self-pay | Admitting: Infectious Diseases

## 2022-04-07 NOTE — Progress Notes (Deleted)
Name: Kyle Young DOB: 1971-02-09 MRN: [id number] PCP: Pcp, No    Subjective:  Brief Narrative: Kyle Young is a 51 y.o. 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  HIV Risk: bisexual.  History of OIs: HPV. Was in care in [place] then moved to [place] and was in the care of [facility] system.   Previous Regimen: Truvada, Norvir + Reyataz  Biktarvy   Genotype:  03/2022 - sensitive    No chief complaint on file.    HPI/ROS: Kyle Young is here for acute visit - called yesterday to report high fevers > 102 with "burning chest coughing and dizziness" that has been going on for 5 days now.     Review of Systems  Constitutional:  Positive for fever.  Respiratory:  Positive for cough and shortness of breath.      Past Medical History:  Diagnosis Date   Depression    Diabetes mellitus    HIV positive (HCC)    Mental disorder    MRSA (methicillin resistant Staphylococcus aureus)    Pneumonia    Outpatient Medications Prior to Visit  Medication Sig Dispense Refill   bictegravir-emtricitabine-tenofovir AF (BIKTARVY) 50-200-25 MG TABS tablet Take 1 tablet by mouth daily. 30 tablet 5   dapsone 100 MG tablet Take 1 tablet (100 mg total) by mouth daily. 30 tablet 5   glucose blood (ONETOUCH VERIO) test strip 1 each by Other route daily. And lancets 1/day 100 each 12   ibuprofen (ADVIL,MOTRIN) 200 MG tablet Take 800 mg by mouth every 6 (six) hours as needed. Patient used this medication for swelling and pain.     insulin glargine (LANTUS SOLOSTAR) 100 UNIT/ML Solostar Pen Inject 20 Units into the skin every morning. And pen needles 1/day. 15 mL 2   Insulin Pen Needle (PEN NEEDLES) 32G X 4 MM MISC 1 Device by Does not apply route daily. 100 each 3   levofloxacin (LEVAQUIN) 500 MG tablet Take 1 tablet (500 mg total) by mouth daily. 28 tablet 0   lisinopril (ZESTRIL) 10 MG tablet Take 1 tablet (10 mg  total) by mouth daily. 30 tablet 2   metFORMIN (GLUCOPHAGE) 1000 MG tablet Take 1 tablet (1,000 mg total) by mouth daily with breakfast for 7 days, THEN 1 tablet (1,000 mg total) 2 (two) times daily with a meal for 23 days. 53 tablet 0   metFORMIN (GLUCOPHAGE) 1000 MG tablet Take 1 tablet (1,000 mg total) by mouth 2 (two) times daily with a meal. 60 tablet 3   No facility-administered medications prior to visit.     Allergies  Allergen Reactions   Sulfa Antibiotics Itching   Sulfamethoxazole-Trimethoprim Itching   Bactrim [Sulfamethoxazole-Trimethoprim] Other (See Comments)   Bactrim Rash and Other (See Comments)    Fainting     Social History   Tobacco Use   Smoking status: Some Days    Packs/day: 0.10    Types: Cigarettes    Last attempt to quit: 09/10/2018    Years since quitting: 3.5   Smokeless tobacco: Never  Substance Use Topics   Alcohol use: Yes    Comment: rarely   Drug use: No    Types: Cocaine    Comment: Quit street drugs 16 months ago.    Social History   Substance and Sexual Activity  Sexual Activity Yes   Partners: Female   Birth control/protection: None   Comment: Declined condoms 06/2021  Physical Exam and Objective Findings:  There were no vitals filed for this visit.  There is no height or weight on file to calculate BMI.     Physical Exam   Lab Results Lab Results  Component Value Date   WBC 4.9 03/15/2022   HGB 14.7 03/15/2022   HCT 44.9 03/15/2022   MCV 92.4 03/15/2022   PLT 247 03/15/2022    Lab Results  Component Value Date   CREATININE 1.13 03/15/2022   BUN 18 03/15/2022   NA 135 03/15/2022   K 4.1 03/15/2022   CL 102 03/15/2022   CO2 26 03/15/2022    Lab Results  Component Value Date   ALT 18 03/15/2022   AST 29 03/15/2022   ALKPHOS 76 03/28/2012   BILITOT 0.3 03/15/2022    Lab Results  Component Value Date   CHOL 119 12/31/2019   HDL 36 (L) 12/31/2019   LDLCALC 65 12/31/2019   TRIG 93 12/31/2019   CHOLHDL  3.3 12/31/2019    HIV 1 RNA Quant  Date Value  03/15/2022 210,000 copies/mL (H)  06/15/2021 4,400 Copies/mL (H)  03/10/2021 327 Copies/mL (H)   CD4 T Cell Abs (/uL)  Date Value  03/15/2022 80 (L)  06/15/2021 190 (L)  12/17/2020 203 (L)    Lab Results  Component Value Date   HAV NON-REACTIVE 11/28/2017   Lab Results  Component Value Date   HEPBSAG NON-REACTIVE 01/02/2019   HEPBSAB REACTIVE (A) 11/28/2017     ASSESSMENT & PLAN:   Problem List Items Addressed This Visit   None    Rexene Alberts, MSN, NP-C [facility]  [email or web address] Pager: [phone] Office: [phone] [facility] Main Line: [phone]

## 2022-04-07 NOTE — Patient Instructions (Incomplete)
     General Recommendations:   Please drink plenty of fluids (coffee, caffeine-containing teas/beverages and alcohol do not count) - Water helps thin the mucus so your sinuses can drain more easily. Get plenty of rest  Sleep in humidified air with the use of a humidifier in bedroom or commonly occupied areas of home Inhale steam 3 to 4 times a day (for example, sit in the bathroom with the shower running) to help with facial pain Saline nose sprays or a netti pot Throat lozenges like Halls for sore throat - Biotene mouth rinses if dry  Over the Counter Medications:  Decongestants - medications that help to helps relieve congestion ("stuffy nose")  Flonase (generic fluticasone) or Nasacort (generic triamcinolone) - this takes at least 1 week to work but would recommend especially for those that have allergies Sudafed (generic pseudoephedrine - Note this is the one that is available behind the pharmacy counter) Products with "-D" on the label mean they already contain pseudoephedrine (Mucinex-D or Zyrtec-D, for example).  Products with phenylephrine (-PE) may also be used but is often not as effective as pseudoephedrine.  If you have HIGH BLOOD PRESSURE - Coricidin HBP should be the only decongestant you choose to use.  AVOID any product that is -D as this contains pseudoephedrine or has a "-D" - this has the potential to significantly increase your blood pressure Afrin (oxymetazoline) every 6-8 hours for up to 3 days ONLY Sinex Nasal Spray for up to 3 days ONLY  Allergie Medicine - relieves runny nose, itchy/watering eyes and sneezing  Claritin (generic loratidine), Allegra (fexofenidine), or Zyrtec (generic cyrterizine) for runny nose. These medications should not cause you to be sleepy. Benadryl (generic diphenhydramine) may also be used if symptoms are severe - this likely will cause drowsiness/tiredness/foggy head so try to use only at night.  Allergies happen all year round not just  over the spring - many people are troubled in the winter season as well. Keep windows closed, rotate air filters regularly, reduce exposure to any triggers (pet hair, dust, weeds/grasses, etc)  Cough -  Delsym or Robitussin (generic dextromethorphan) Products with "-DM" contain dextromethorphan ingredient for cough (Mucinex-DM, for example) Honey, straight-up Lemon water Hot tea  Ginger  Vics Vapor Rub   Expectorants - helps loosen mucus to make it easier to cough up from chest   Mucinex (generic guaifenesin) as directed on the package Fluids!  Headaches / General Aches  Tylenol (generic acetaminophen) - DO NOT EXCEED 3 grams (3,000 mg) in a 24 hour time period (Take 2 extra strength tablets 6 hours apart for 3 doses) Advil/Motrin (generic ibuprofen) - take 2 tablets every 8 hours  Aleve (generic naproxen) - do not combine if taking ibuprofen/Advil/Motrin If you have high blood pressure use Tylenol to treat pain/headaches   Sore Throat -  Salt water gargle  Chloraseptic (generic benzocaine) spray or lozenges / Sucrets (generic dyclonine) Halls throat lozenges

## 2022-05-03 ENCOUNTER — Ambulatory Visit: Payer: Self-pay | Admitting: Infectious Diseases

## 2022-05-06 ENCOUNTER — Ambulatory Visit: Payer: Self-pay | Admitting: Infectious Diseases

## 2022-05-18 ENCOUNTER — Other Ambulatory Visit: Payer: Self-pay

## 2022-05-18 ENCOUNTER — Ambulatory Visit (INDEPENDENT_AMBULATORY_CARE_PROVIDER_SITE_OTHER): Payer: Self-pay | Admitting: Infectious Diseases

## 2022-05-18 ENCOUNTER — Encounter: Payer: Self-pay | Admitting: Infectious Diseases

## 2022-05-18 VITALS — BP 126/80 | HR 94 | Temp 97.9°F | Wt 194.0 lb

## 2022-05-18 DIAGNOSIS — E083513 Diabetes mellitus due to underlying condition with proliferative diabetic retinopathy with macular edema, bilateral: Secondary | ICD-10-CM

## 2022-05-18 DIAGNOSIS — B20 Human immunodeficiency virus [HIV] disease: Secondary | ICD-10-CM

## 2022-05-18 MED ORDER — INSULIN DETEMIR 100 UNIT/ML FLEXPEN
10.0000 [IU] | Freq: Two times a day (BID) | SUBCUTANEOUS | 0 refills | Status: DC
Start: 2022-05-18 — End: 2022-05-18

## 2022-05-18 MED ORDER — LEVEMIR FLEXPEN 100 UNIT/ML ~~LOC~~ SOPN
10.0000 [IU] | PEN_INJECTOR | Freq: Two times a day (BID) | SUBCUTANEOUS | 0 refills | Status: DC
Start: 2022-05-18 — End: 2022-06-28

## 2022-05-18 NOTE — Progress Notes (Signed)
Name: Kyle Young DOB: Jan 02, 1971 MRN: [id number] PCP: Pcp, No    Subjective:  Brief Narrative: Kyle Young is a 52 y.o. 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  HIV Risk: bisexual.  History of OIs: HPV. Was in care in [place] then moved to [place] and was in the care of [facility] system.    Previous Regimen: Truvada, Norvir + Reyataz  Biktarvy   Genotype:     Chief Complaint  Patient presents with   Follow-up     HPI/ROS: Doing much better. He says that taking his medication with him and using the keychain pill holder has made him the most successful where he has not missed any doses of Biktarvy. Wishes to go over labs. Gained 4 lbs and appetite better.   Ran out of his Lantus - takes it twice a day - 25 once around lunch and another 10-15 in the late afternoon when he feels hyperglycemic sometimes.   Working 4 days a week, food resources are a little better and he is using them more efficiently. No food stamps yet.     Review of Systems  Constitutional:  Negative for appetite change, chills, fatigue, fever and unexpected weight change.  Eyes:  Negative for visual disturbance.  Respiratory:  Negative for cough and shortness of breath.   Cardiovascular:  Negative for chest pain and leg swelling.  Gastrointestinal:  Negative for abdominal pain, diarrhea and nausea.  Genitourinary:  Negative for dysuria, genital sores and penile discharge.  Musculoskeletal:  Negative for joint swelling.  Skin:  Negative for color change and rash.  Neurological:  Negative for dizziness and headaches.  Hematological:  Negative for adenopathy.  Psychiatric/Behavioral:  Negative for sleep disturbance. The patient is not nervous/anxious.      Past Medical History:  Diagnosis Date   Depression    Diabetes mellitus    HIV positive (HCC)    Mental disorder    MRSA (methicillin resistant  Staphylococcus aureus)    Pneumonia    Outpatient Medications Prior to Visit  Medication Sig Dispense Refill   bictegravir-emtricitabine-tenofovir AF (BIKTARVY) 50-200-25 MG TABS tablet Take 1 tablet by mouth daily. 30 tablet 5   dapsone 100 MG tablet Take 1 tablet (100 mg total) by mouth daily. 30 tablet 5   glucose blood (ONETOUCH VERIO) test strip 1 each by Other route daily. And lancets 1/day 100 each 12   ibuprofen (ADVIL,MOTRIN) 200 MG tablet Take 800 mg by mouth every 6 (six) hours as needed. Patient used this medication for swelling and pain.     Insulin Pen Needle (PEN NEEDLES) 32G X 4 MM MISC 1 Device by Does not apply route daily. 100 each 3   levofloxacin (LEVAQUIN) 500 MG tablet Take 1 tablet (500 mg total) by mouth daily. 28 tablet 0   lisinopril (ZESTRIL) 10 MG tablet Take 1 tablet (10 mg total) by mouth daily. 30 tablet 2   metFORMIN (GLUCOPHAGE) 1000 MG tablet Take 1 tablet (1,000 mg total) by mouth 2 (two) times daily with a meal. 60 tablet 3   insulin glargine (LANTUS SOLOSTAR) 100 UNIT/ML Solostar Pen Inject 20 Units into the skin every morning. And pen needles 1/day. 15 mL 2   metFORMIN (GLUCOPHAGE) 1000 MG tablet Take 1 tablet (1,000 mg total) by mouth daily with breakfast for 7 days, THEN 1 tablet (1,000 mg total) 2 (two) times daily with a meal for  23 days. 53 tablet 0   No facility-administered medications prior to visit.     Allergies  Allergen Reactions   Sulfa Antibiotics Itching   Sulfamethoxazole-Trimethoprim Itching   Bactrim [Sulfamethoxazole-Trimethoprim] Other (See Comments)   Bactrim Rash and Other (See Comments)    Fainting     Social History   Tobacco Use   Smoking status: Some Days    Packs/day: 0.10    Types: Cigarettes    Last attempt to quit: 09/10/2018    Years since quitting: 3.6   Smokeless tobacco: Never  Substance Use Topics   Alcohol use: Yes    Comment: rarely   Drug use: No    Types: Cocaine    Comment: Quit street drugs 16  months ago.    Social History   Substance and Sexual Activity  Sexual Activity Yes   Partners: Female   Birth control/protection: None   Comment: Declined condoms 06/2021    Physical Exam and Objective Findings:  Vitals:   05/18/22 1342  BP: 126/80  Pulse: 94  Temp: 97.9 F (36.6 C)  TempSrc: Temporal  Weight: 194 lb (88 kg)   Body mass index is 29.07 kg/m.     Physical Exam Constitutional:      Appearance: Normal appearance. He is not ill-appearing.  HENT:     Head: Normocephalic.     Mouth/Throat:     Mouth: Mucous membranes are moist.     Pharynx: Oropharynx is clear.  Eyes:     General: No scleral icterus. Pulmonary:     Effort: Pulmonary effort is normal.  Musculoskeletal:        General: Normal range of motion.     Cervical back: Normal range of motion.  Skin:    Coloration: Skin is not jaundiced or pale.  Neurological:     Mental Status: He is alert and oriented to person, place, and time.  Psychiatric:        Mood and Affect: Mood normal.        Judgment: Judgment normal.      Lab Results Lab Results  Component Value Date   WBC 4.9 03/15/2022   HGB 14.7 03/15/2022   HCT 44.9 03/15/2022   MCV 92.4 03/15/2022   PLT 247 03/15/2022    Lab Results  Component Value Date   CREATININE 1.13 03/15/2022   BUN 18 03/15/2022   NA 135 03/15/2022   K 4.1 03/15/2022   CL 102 03/15/2022   CO2 26 03/15/2022    Lab Results  Component Value Date   ALT 18 03/15/2022   AST 29 03/15/2022   ALKPHOS 76 03/28/2012   BILITOT 0.3 03/15/2022    Lab Results  Component Value Date   CHOL 119 12/31/2019   HDL 36 (L) 12/31/2019   LDLCALC 65 12/31/2019   TRIG 93 12/31/2019   CHOLHDL 3.3 12/31/2019    HIV 1 RNA Quant  Date Value  03/15/2022 210,000 copies/mL (H)  06/15/2021 4,400 Copies/mL (H)  03/10/2021 327 Copies/mL (H)   CD4 T Cell Abs (/uL)  Date Value  03/15/2022 80 (L)  06/15/2021 190 (L)  12/17/2020 203 (L)    Lab Results  Component  Value Date   HAV NON-REACTIVE 11/28/2017   Lab Results  Component Value Date   HEPBSAG NON-REACTIVE 01/02/2019   HEPBSAB REACTIVE (A) 11/28/2017     ASSESSMENT & PLAN:   Problem List Items Addressed This Visit       Unprioritized   Diabetes mellitus (HCC) (Chronic)  Lab Results  Component Value Date   HGBA1C 10.4 (H) 03/15/2022  Will switch him to Levimir flex pen as this is on ADAP formularly now - start 10 mg BID dosing for 0.2 mg/kg/day dose.  Counseled that these are long acting insulins and not meant to be given for quick response and puts him at high risk for hypoglycemic events.  I asked him to please go upstairs to 3rd floor today while he is here to set up a new patient visit with [facility] team for better diabetes care.       Relevant Medications   insulin detemir (LEVEMIR FLEXPEN) 100 UNIT/ML FlexPen   HIV disease (HCC) - Primary (Chronic)    Adherence has improved over the last 2 months and no missed doses. Congratulated on his work! Happy with weight regain. We reviewed labs today and with CD4 80 recommended he resume the dapsone daily (bactrim allergy). He will consider.  VL check today to monitor therapeutic response.  RTC in 6 weeks.       Relevant Orders   HIV 1 RNA quant-no reflex-bld     Rexene Alberts, MSN, NP-C [facility]  [email or web address] Pager: [phone] Office: [phone] [facility] Main Line: [phone]

## 2022-05-18 NOTE — Assessment & Plan Note (Signed)
Adherence has improved over the last 2 months and no missed doses. Congratulated on his work! Happy with weight regain. We reviewed labs today and with CD4 80 recommended he resume the dapsone daily (bactrim allergy). He will consider.  VL check today to monitor therapeutic response.  RTC in 6 weeks.

## 2022-05-18 NOTE — Assessment & Plan Note (Signed)
Lab Results  Component Value Date   HGBA1C 10.4 (H) 03/15/2022   Will switch him to Levimir flex pen as this is on ADAP formularly now - start 10 mg BID dosing for 0.2 mg/kg/day dose.  Counseled that these are long acting insulins and not meant to be given for quick response and puts him at high risk for hypoglycemic events.  I asked him to please go upstairs to 3rd floor today while he is here to set up a new patient visit with [facility] team for better diabetes care.

## 2022-05-18 NOTE — Patient Instructions (Addendum)
Keep your medications with you so you don't miss.   Start back the Dapsone once a day with your Biktarvy.   Look into Food Stamps with [facility]   If you have a moment today - go up to the 3rd floor to see if you can schedule a new patient visit with the [facility]. I need you to have the best access to diabetes care supplies, and that's not me.   STOP the lantus - throw it away.   START Levimir 10 units twice a day. I will send in a flex pen for you to the pharmacy and they should pay for this.

## 2022-05-21 LAB — HIV-1 RNA QUANT-NO REFLEX-BLD
HIV 1 RNA Quant: 40 Copies/mL — ABNORMAL HIGH
HIV-1 RNA Quant, Log: 1.6 Log cps/mL — ABNORMAL HIGH

## 2022-06-28 ENCOUNTER — Other Ambulatory Visit: Payer: Self-pay

## 2022-06-28 ENCOUNTER — Encounter: Payer: Self-pay | Admitting: Infectious Diseases

## 2022-06-28 ENCOUNTER — Ambulatory Visit (INDEPENDENT_AMBULATORY_CARE_PROVIDER_SITE_OTHER): Payer: Self-pay | Admitting: Infectious Diseases

## 2022-06-28 VITALS — BP 149/90 | HR 90 | Temp 98.3°F | Wt 193.0 lb

## 2022-06-28 DIAGNOSIS — E083513 Diabetes mellitus due to underlying condition with proliferative diabetic retinopathy with macular edema, bilateral: Secondary | ICD-10-CM

## 2022-06-28 DIAGNOSIS — B20 Human immunodeficiency virus [HIV] disease: Secondary | ICD-10-CM

## 2022-06-28 MED ORDER — LEVEMIR FLEXPEN 100 UNIT/ML ~~LOC~~ SOPN: 20.0000 [IU] | PEN_INJECTOR | Freq: Two times a day (BID) | SUBCUTANEOUS | 2 refills | Status: DC

## 2022-06-28 NOTE — Patient Instructions (Addendum)
For the new insulin you are using - LEVEMIR  START injecting 20 units in the morning AND 20 units in the evening.  This is a twice a day insulin for you.   Goal would be to try to get your blood sugars to be less than 200 when you check.   It is helpful if you check a fasting level in the morning when you wake up and prior to bed and keep track of them. Since your insulin is twice a day would like for you to consider checking blood sugars twice a day for now.   Continue the dapsone and biktarvy everyday.   Would like to see you in November for a check - can do your labs prior to if you can do that.

## 2022-06-28 NOTE — Assessment & Plan Note (Signed)
Very well controlled on once daily Biktarvy with last VL 40 copies. Continue dapsone for OI prophylaxis.  Reminded about ADAP re-enrollment in July / January.  No dental needs today.  No concern over anxious/depressed mood.  Sexual health and family planning discussed - no needs today.   RTC in 41m.

## 2022-06-28 NOTE — Assessment & Plan Note (Signed)
Increase levemir 40 units daily in 2 divided doses.  Will repeat A1C at return visit in 39m. Maybe we can look into ozempic to help with blood sugar regulation as well if not agreeable to pills at this time.  Have tried to get him to go see primary care or endocrinology for best care.  Will continue to try to recommend statin, ACEi as well if he will let me.

## 2022-06-28 NOTE — Progress Notes (Signed)
Name: Kyle Young DOB: [birth year] MRN: [id number] PCP: Pcp, No    Subjective:  Brief Narrative: Hiroki is a 51 y.o. 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  HIV Risk: bisexual.  History of OIs: HPV. Was in care in [place] then moved to [place] and was in the care of [facility].    Previous Regimen: Truvada, Norvir + Reyataz  Biktarvy   Genotype:     Chief Complaint  Patient presents with   Follow-up    Blood sugar 240 and up avg.      HPI/ROS: Manly continues to do well taking his biktarvy and dapsone everyday.  Started the levemir injection - using 30 units once a day currently. Does not want to take oral meds for diabetes at the moment. Checks BS once a day in the evening and ranges around 240 or so. His appetite has been much improved since getting HIV under better control and he is happy about regain of healthy weight.   His mood has been good. Continues to work a lot in [employer].    Review of Systems  Constitutional:  Negative for appetite change, chills, fatigue, fever and unexpected weight change.  Eyes:  Negative for visual disturbance.  Respiratory:  Negative for cough and shortness of breath.   Cardiovascular:  Negative for chest pain and leg swelling.  Gastrointestinal:  Negative for abdominal pain, diarrhea and nausea.  Genitourinary:  Negative for dysuria, genital sores and penile discharge.  Musculoskeletal:  Negative for joint swelling.  Skin:  Negative for color change and rash.  Neurological:  Negative for dizziness and headaches.  Hematological:  Negative for adenopathy.  Psychiatric/Behavioral:  Negative for sleep disturbance. The patient is not nervous/anxious.      Past Medical History:  Diagnosis Date   Depression    Diabetes mellitus    HIV positive (HCC)    Mental disorder    MRSA (methicillin resistant Staphylococcus  aureus)    Pneumonia    Outpatient Medications Prior to Visit  Medication Sig Dispense Refill   bictegravir-emtricitabine-tenofovir AF (BIKTARVY) 50-200-25 MG TABS tablet Take 1 tablet by mouth daily. 30 tablet 5   glucose blood (ONETOUCH VERIO) test strip 1 each by Other route daily. And lancets 1/day 100 each 12   Insulin Pen Needle (PEN NEEDLES) 32G X 4 MM MISC 1 Device by Does not apply route daily. 100 each 3   insulin detemir (LEVEMIR FLEXPEN) 100 UNIT/ML FlexPen Inject 10 Units into the skin 2 (two) times daily. 15 mL 0   dapsone 100 MG tablet Take 1 tablet (100 mg total) by mouth daily. (Patient not taking: Reported on 06/28/2022) 30 tablet 5   ibuprofen (ADVIL,MOTRIN) 200 MG tablet Take 800 mg by mouth every 6 (six) hours as needed. Patient used this medication for swelling and pain. (Patient not taking: Reported on 06/28/2022)     metFORMIN (GLUCOPHAGE) 1000 MG tablet Take 1 tablet (1,000 mg total) by mouth daily with breakfast for 7 days, THEN 1 tablet (1,000 mg total) 2 (two) times daily with a meal for 23 days. 53 tablet 0   levofloxacin (LEVAQUIN) 500 MG tablet Take 1 tablet (500 mg total) by mouth daily. (Patient not taking: Reported on 06/28/2022) 28 tablet 0   lisinopril (ZESTRIL) 10 MG tablet Take 1 tablet (10 mg total) by mouth daily. (Patient not taking: Reported on 06/28/2022) 30 tablet 2  metFORMIN (GLUCOPHAGE) 1000 MG tablet Take 1 tablet (1,000 mg total) by mouth 2 (two) times daily with a meal. (Patient not taking: Reported on 06/28/2022) 60 tablet 3   No facility-administered medications prior to visit.     Allergies  Allergen Reactions   Sulfa Antibiotics Itching   Sulfamethoxazole-Trimethoprim Itching   Bactrim [Sulfamethoxazole-Trimethoprim] Other (See Comments)   Bactrim Rash and Other (See Comments)    Fainting     Social History   Tobacco Use   Smoking status: Some Days    Packs/day: 0.10    Types: Cigarettes    Last attempt to quit: 09/10/2018    Years  since quitting: 3.8   Smokeless tobacco: Never  Substance Use Topics   Alcohol use: Yes    Comment: rarely   Drug use: No    Types: Cocaine    Comment: Quit street drugs 16 months ago.    Social History   Substance and Sexual Activity  Sexual Activity Yes   Partners: Female   Birth control/protection: None   Comment: Declined condoms 06/2021    Physical Exam and Objective Findings:  Vitals:   06/28/22 0832  BP: (!) 149/90  Pulse: 90  Temp: 98.3 F (36.8 C)  TempSrc: Oral  SpO2: 98%  Weight: 193 lb (87.5 kg)   Body mass index is 28.92 kg/m.     Physical Exam Constitutional:      Appearance: Normal appearance. He is not ill-appearing.  HENT:     Head: Normocephalic.     Mouth/Throat:     Mouth: Mucous membranes are moist.     Pharynx: Oropharynx is clear.  Eyes:     General: No scleral icterus. Pulmonary:     Effort: Pulmonary effort is normal.  Musculoskeletal:        General: Normal range of motion.     Cervical back: Normal range of motion.  Skin:    Coloration: Skin is not jaundiced or pale.  Neurological:     Mental Status: He is alert and oriented to person, place, and time.  Psychiatric:        Mood and Affect: Mood normal.        Judgment: Judgment normal.      Lab Results Lab Results  Component Value Date   WBC 4.9 03/15/2022   HGB 14.7 03/15/2022   HCT 44.9 03/15/2022   MCV 92.4 03/15/2022   PLT 247 03/15/2022    Lab Results  Component Value Date   CREATININE 1.13 03/15/2022   BUN 18 03/15/2022   NA 135 03/15/2022   K 4.1 03/15/2022   CL 102 03/15/2022   CO2 26 03/15/2022    Lab Results  Component Value Date   ALT 18 03/15/2022   AST 29 03/15/2022   ALKPHOS 76 03/28/2012   BILITOT 0.3 03/15/2022    Lab Results  Component Value Date   CHOL 119 12/31/2019   HDL 36 (L) 12/31/2019   LDLCALC 65 12/31/2019   TRIG 93 12/31/2019   CHOLHDL 3.3 12/31/2019    HIV 1 RNA Quant  Date Value  05/18/2022 40 Copies/mL (H)   03/15/2022 210,000 copies/mL (H)  06/15/2021 4,400 Copies/mL (H)   CD4 T Cell Abs (/uL)  Date Value  03/15/2022 80 (L)  06/15/2021 190 (L)  12/17/2020 203 (L)    Lab Results  Component Value Date   HAV NON-REACTIVE 11/28/2017   Lab Results  Component Value Date   HEPBSAG NON-REACTIVE 01/02/2019   HEPBSAB REACTIVE (A) 11/28/2017  ASSESSMENT & PLAN:   Problem List Items Addressed This Visit       Unprioritized   HIV disease ([place]) (Chronic)    Very well controlled on once daily Biktarvy with last VL 40 copies. Continue dapsone for OI prophylaxis.  Reminded about ADAP re-enrollment in July / January.  No dental needs today.  No concern over anxious/depressed mood.  Sexual health and family planning discussed - no needs today.   RTC in 23m.       Relevant Orders   COMPLETE METABOLIC PANEL WITH GFR   HIV 1 RNA quant-no reflex-bld   T-helper cells (CD4) count   Diabetes mellitus (HCC) - Primary (Chronic)    Increase levemir 40 units daily in 2 divided doses.  Will repeat A1C at return visit in 40m. Maybe we can look into ozempic to help with blood sugar regulation as well if not agreeable to pills at this time.  Have tried to get him to go see primary care or endocrinology for best care.  Will continue to try to recommend statin, ACEi as well if he will let me.       Relevant Medications   insulin detemir (LEVEMIR FLEXPEN) 100 UNIT/ML FlexPen   Other Relevant Orders   Hemoglobin A1c     Janene Madeira, MSN, NP-C [email or web address] Pager: [phone] Office: [phone] [place]: [phone]

## 2022-07-08 DEATH — deceased

## 2022-09-19 ENCOUNTER — Ambulatory Visit: Payer: Self-pay | Admitting: Infectious Diseases
